# Patient Record
Sex: Female | Born: 1975
Health system: Southern US, Community
[De-identification: ages and names within clinical notes are randomized; demographics above are authoritative.]

## PROBLEM LIST (undated history)

## (undated) DIAGNOSIS — K649 Unspecified hemorrhoids: Secondary | ICD-10-CM

## (undated) DIAGNOSIS — R87629 Unspecified abnormal cytological findings in specimens from vagina: Secondary | ICD-10-CM

## (undated) DIAGNOSIS — R011 Cardiac murmur, unspecified: Secondary | ICD-10-CM

## (undated) DIAGNOSIS — B009 Herpesviral infection, unspecified: Secondary | ICD-10-CM

## (undated) HISTORY — PX: COLPOSCOPY: SHX161

## (undated) HISTORY — PX: OTHER SURGICAL HISTORY: SHX169

## (undated) HISTORY — DX: Unspecified hemorrhoids: K64.9

## (undated) HISTORY — DX: Herpesviral infection, unspecified: B00.9

## (undated) HISTORY — DX: Unspecified abnormal cytological findings in specimens from vagina: R87.629

## (undated) HISTORY — PX: BREAST BIOPSY: SHX20

## (undated) HISTORY — PX: TUBAL LIGATION: SHX77

## (undated) HISTORY — DX: Cardiac murmur, unspecified: R01.1

## (undated) HISTORY — PX: CARDIAC SURGERY: SHX584

---

## 2017-07-25 ENCOUNTER — Telehealth: Payer: Self-pay | Admitting: *Deleted

## 2017-07-25 ENCOUNTER — Encounter: Payer: Self-pay | Admitting: Obstetrics & Gynecology

## 2017-07-25 NOTE — Telephone Encounter (Signed)
Called pt this morning around 8:00am to make sure she had her medical records with her or if she had them sent and we didn't receive them. Left her a voicemail for a return call before her appt time of 9:15am. She was a NO SHOW for that appt.

## 2017-08-22 ENCOUNTER — Encounter: Payer: Self-pay | Admitting: Obstetrics and Gynecology

## 2017-08-22 ENCOUNTER — Ambulatory Visit: Payer: Self-pay | Admitting: Obstetrics and Gynecology

## 2017-08-22 NOTE — Progress Notes (Signed)
Erroneous encounter. Patient not seen.

## 2017-11-07 ENCOUNTER — Encounter: Payer: Self-pay | Admitting: Obstetrics & Gynecology

## 2017-11-07 DIAGNOSIS — Z01419 Encounter for gynecological examination (general) (routine) without abnormal findings: Secondary | ICD-10-CM

## 2017-12-04 ENCOUNTER — Ambulatory Visit (INDEPENDENT_AMBULATORY_CARE_PROVIDER_SITE_OTHER): Payer: BLUE CROSS/BLUE SHIELD | Admitting: Certified Nurse Midwife

## 2017-12-04 ENCOUNTER — Ambulatory Visit (INDEPENDENT_AMBULATORY_CARE_PROVIDER_SITE_OTHER): Payer: BLUE CROSS/BLUE SHIELD

## 2017-12-04 ENCOUNTER — Encounter: Payer: Self-pay | Admitting: Certified Nurse Midwife

## 2017-12-04 VITALS — BP 114/75 | HR 82 | Resp 16 | Ht 64.0 in | Wt 185.0 lb

## 2017-12-04 DIAGNOSIS — N852 Hypertrophy of uterus: Secondary | ICD-10-CM | POA: Diagnosis not present

## 2017-12-04 DIAGNOSIS — Z01419 Encounter for gynecological examination (general) (routine) without abnormal findings: Secondary | ICD-10-CM

## 2017-12-04 DIAGNOSIS — B9689 Other specified bacterial agents as the cause of diseases classified elsewhere: Secondary | ICD-10-CM

## 2017-12-04 DIAGNOSIS — Z124 Encounter for screening for malignant neoplasm of cervix: Secondary | ICD-10-CM

## 2017-12-04 DIAGNOSIS — Z113 Encounter for screening for infections with a predominantly sexual mode of transmission: Secondary | ICD-10-CM | POA: Diagnosis not present

## 2017-12-04 DIAGNOSIS — Z1151 Encounter for screening for human papillomavirus (HPV): Secondary | ICD-10-CM | POA: Diagnosis not present

## 2017-12-04 DIAGNOSIS — N760 Acute vaginitis: Secondary | ICD-10-CM | POA: Diagnosis not present

## 2017-12-04 DIAGNOSIS — N898 Other specified noninflammatory disorders of vagina: Secondary | ICD-10-CM

## 2017-12-04 DIAGNOSIS — K642 Third degree hemorrhoids: Secondary | ICD-10-CM

## 2017-12-04 NOTE — Patient Instructions (Signed)
Uterine Fibroids Uterine fibroids are tissue masses (tumors). They are also called leiomyomas. They can develop inside of a woman's womb (uterus). They can grow very large. Fibroids are not cancerous (benign). Most fibroids do not require medical treatment. Follow these instructions at home:  Keep all follow-up visits as told by your doctor. This is important.  Take medicines only as told by your doctor. ? If you were prescribed a hormone treatment, take the hormone medicines exactly as told. ? Do not take aspirin. It can cause bleeding.  Ask your doctor about taking iron pills and increasing the amount of dark green, leafy vegetables in your diet. These actions can help to boost your blood iron levels.  Pay close attention to your period. Tell your doctor about any changes, such as: ? Increased blood flow. This may require you to use more pads or tampons than usual per month. ? A change in the number of days that your period lasts per month. ? A change in symptoms that come with your period, such as back pain or cramping in your belly area (abdomen). Contact a doctor if:  You have pain in your back or the area between your hip bones (pelvic area) that is not controlled by medicines.  You have pain in your abdomen that is not controlled with medicines.  You have an increase in bleeding between and during periods.  You soak tampons or pads in a half hour or less.  You feel lightheaded.  You feel extra tired.  You feel weak. Get help right away if:  You pass out (faint).  You have a sudden increase in pelvic pain. This information is not intended to replace advice given to you by your health care provider. Make sure you discuss any questions you have with your health care provider. Document Released: 03/25/2010 Document Revised: 10/22/2015 Document Reviewed: 08/19/2013 Elsevier Interactive Patient Education  2018 Elsevier Inc.  

## 2017-12-05 NOTE — Progress Notes (Signed)
GYNECOLOGY ANNUAL PREVENTATIVE CARE ENCOUNTER NOTE  Subjective:   Patricia Griffith is a 42 y.o. [phone] female here for a new patient annual gynecologic exam.  Current complaints: hemorrhoids and vaginal irritation.   Denies abnormal vaginal bleeding, discharge, pelvic pain, or other gynecologic concerns. She reports no intercourse in the past 2 years.    Gynecologic History Patient's last menstrual period was 11/29/2017. Contraception: none Last Pap: patient does not recall date of last Pap but reports it being abnormal She reports never having a mammogram.   Obstetric History OB History  Gravida Para Term Preterm AB Living  5 3 3   2 3   SAB TAB Ectopic Multiple Live Births    2          # Outcome Date GA Lbr Len/2nd Weight Sex Delivery Anes PTL Lv  5 TAB           4 TAB           3 Term           2 Term           1 Term             Past Medical History:  Diagnosis Date  . Hemorrhoids   . HSV-2 (herpes simplex virus 2) infection   . Vaginal Pap smear, abnormal     Past Surgical History:  Procedure Laterality Date  . COLPOSCOPY    . TUBAL LIGATION      No current outpatient medications on file prior to visit.   No current facility-administered medications on file prior to visit.     Allergies  Allergen Reactions  . Sulfa Antibiotics Hives  . Tucks Swelling and Rash    Social History:  reports that she has been smoking cigarettes. She has a 18.00 pack-year smoking history. She has never used smokeless tobacco. She reports that she drinks alcohol. She reports that she does not use drugs.  Family History  Problem Relation Age of Onset  . Heart failure Mother   . Diabetes Mother   . Congestive Heart Failure Father   . Breast cancer Maternal Grandmother   . Diabetes Maternal Grandmother   . Liver cancer Paternal Grandmother   . Prostate cancer Paternal Grandfather     The following portions of the patient's history were reviewed and updated as  appropriate: allergies, current medications, past family history, past medical history, past social history, past surgical history and problem list.  Review of Systems Pertinent items noted in HPI and remainder of comprehensive ROS otherwise negative.   Objective:  BP 114/75   Pulse 82   Resp 16   Ht 5\' 4"  (1.626 m)   Wt 185 lb (83.9 kg)   LMP 11/29/2017   BMI 31.76 kg/m  CONSTITUTIONAL: Well-developed, well-nourished female in no acute distress.  HENT:  Normocephalic, atraumatic, External right and left ear normal. Oropharynx is clear and moist EYES: Conjunctivae and EOM are normal. Pupils are equal, round, and reactive to light. NECK: Normal range of motion, supple, no masses.  Normal thyroid.  SKIN: Skin is warm and dry. No rash noted. Not diaphoretic. No erythema. No pallor. MUSCULOSKELETAL: Normal range of motion. No tenderness.  No cyanosis, clubbing, or edema.  2+ distal pulses. NEUROLOGIC: Alert and oriented to person, place, and time. Normal reflexes, muscle tone coordination.  PSYCHIATRIC: Normal mood and affect. Normal behavior. Normal judgment and thought content. CARDIOVASCULAR: Normal heart rate noted, regular rhythm RESPIRATORY: Clear to auscultation bilaterally.  Effort and breath sounds normal, no problems with respiration noted. BREASTS: Symmetric in size. No masses, skin changes, nipple drainage, or lymphadenopathy. ABDOMEN: Soft, normal bowel sounds, no distention noted.  No tenderness, rebound or guarding.  PELVIC: Normal appearing external genitalia; normal appearing vaginal mucosa and cervix.  No abnormal discharge noted.  Pap smear obtained.  Enlarged uterus, no other palpable masses, no uterine or adnexal tenderness.   Assessment and Plan:  1. Encounter for annual routine gynecological examination - Well woman examination complete, normal except for enlarged uterus, possible fibroid  - Patient denies history of fibroids or family hx  - US Transvaginal Non-OB;  Future - Cytology - PAP  2. Enlarged uterus - US Transvaginal Non-OB completed directly after appointment  - [place] reports reviewed, no abnormalities and normal sized uterus   3. Well woman exam - Educated on the importance of starting mammogram screenings this year due to age, patient verbalizes understanding  - MS Digital Screening; Future  4. Grade III hemorrhoids - External hemorrhoids, not thrombosed and easily reduced  - Patient reports high diet in carbs and no fiber, educated and discussed importance of increasing amount of fiber in diet to reduce amount of strain. Use of hemorrhoid cream external instead of internal suppository and warm sitz baths.    Will follow up results of pap smear and manage accordingly. Mammogram scheduled Routine preventative health maintenance measures emphasized. Please refer to After Visit Summary for other counseling recommendations.    Lajean Manes, [place] for [employer], [place]

## 2017-12-06 LAB — CYTOLOGY - PAP
Adequacy: ABSENT
Bacterial vaginitis: POSITIVE — AB
Candida vaginitis: NEGATIVE
Chlamydia: NEGATIVE
Diagnosis: NEGATIVE
HPV: NOT DETECTED
Neisseria Gonorrhea: NEGATIVE
Trichomonas: NEGATIVE

## 2017-12-06 MED ORDER — TINIDAZOLE 500 MG PO TABS: 2.0000 g | ORAL_TABLET | Freq: Every day | ORAL | 0 refills | Status: DC

## 2017-12-06 NOTE — Addendum Note (Signed)
Addended by: Lajean Manes on: 12/06/2017 09:04 AM   Modules accepted: Orders

## 2017-12-14 ENCOUNTER — Ambulatory Visit: Payer: BLUE CROSS/BLUE SHIELD

## 2017-12-20 ENCOUNTER — Ambulatory Visit (INDEPENDENT_AMBULATORY_CARE_PROVIDER_SITE_OTHER): Payer: BLUE CROSS/BLUE SHIELD

## 2017-12-20 ENCOUNTER — Ambulatory Visit: Payer: BLUE CROSS/BLUE SHIELD | Admitting: Osteopathic Medicine

## 2017-12-20 ENCOUNTER — Telehealth: Payer: Self-pay | Admitting: Osteopathic Medicine

## 2017-12-20 DIAGNOSIS — Z1231 Encounter for screening mammogram for malignant neoplasm of breast: Secondary | ICD-10-CM | POA: Diagnosis not present

## 2017-12-20 DIAGNOSIS — Z01419 Encounter for gynecological examination (general) (routine) without abnormal findings: Secondary | ICD-10-CM

## 2017-12-20 DIAGNOSIS — R928 Other abnormal and inconclusive findings on diagnostic imaging of breast: Secondary | ICD-10-CM | POA: Diagnosis not present

## 2017-12-20 NOTE — Telephone Encounter (Signed)
Looks like Donn Pierini has already rescheduled with this patient in Epic

## 2017-12-20 NOTE — Telephone Encounter (Signed)
No-show to establish care with Dr. Sheppard Coil, 12/20/17. If late or no-show again, will not accept patient to this clinic. Please call her to reschedule visit and inform them of this policy.

## 2017-12-20 NOTE — Telephone Encounter (Signed)
Routing to scheduler

## 2017-12-21 ENCOUNTER — Other Ambulatory Visit: Payer: Self-pay | Admitting: Certified Nurse Midwife

## 2017-12-21 DIAGNOSIS — R928 Other abnormal and inconclusive findings on diagnostic imaging of breast: Secondary | ICD-10-CM

## 2018-01-03 ENCOUNTER — Other Ambulatory Visit: Payer: Self-pay | Admitting: Certified Nurse Midwife

## 2018-01-03 ENCOUNTER — Ambulatory Visit
Admission: RE | Admit: 2018-01-03 | Discharge: 2018-01-03 | Disposition: A | Discharge status: Home / Self Care | Payer: BLUE CROSS/BLUE SHIELD | Source: Ambulatory Visit | Attending: Certified Nurse Midwife | Admitting: Certified Nurse Midwife

## 2018-01-03 DIAGNOSIS — R599 Enlarged lymph nodes, unspecified: Secondary | ICD-10-CM

## 2018-01-03 DIAGNOSIS — R928 Other abnormal and inconclusive findings on diagnostic imaging of breast: Secondary | ICD-10-CM | POA: Diagnosis not present

## 2018-01-08 ENCOUNTER — Inpatient Hospital Stay: Admission: RE | Admit: 2018-01-08 | Payer: BLUE CROSS/BLUE SHIELD | Source: Ambulatory Visit

## 2018-01-15 ENCOUNTER — Ambulatory Visit
Admission: RE | Admit: 2018-01-15 | Discharge: 2018-01-15 | Disposition: A | Discharge status: Home / Self Care | Payer: BLUE CROSS/BLUE SHIELD | Source: Ambulatory Visit | Attending: Certified Nurse Midwife | Admitting: Certified Nurse Midwife

## 2018-01-15 ENCOUNTER — Other Ambulatory Visit (HOSPITAL_COMMUNITY)
Admission: RE | Admit: 2018-01-15 | Payer: BLUE CROSS/BLUE SHIELD | Source: Ambulatory Visit | Admitting: Diagnostic Radiology

## 2018-01-15 ENCOUNTER — Other Ambulatory Visit: Payer: Self-pay | Admitting: Certified Nurse Midwife

## 2018-01-15 DIAGNOSIS — I898 Other specified noninfective disorders of lymphatic vessels and lymph nodes: Secondary | ICD-10-CM | POA: Diagnosis not present

## 2018-01-15 DIAGNOSIS — R599 Enlarged lymph nodes, unspecified: Secondary | ICD-10-CM

## 2018-01-15 DIAGNOSIS — R590 Localized enlarged lymph nodes: Secondary | ICD-10-CM | POA: Diagnosis not present

## 2018-01-16 ENCOUNTER — Ambulatory Visit (INDEPENDENT_AMBULATORY_CARE_PROVIDER_SITE_OTHER): Payer: BLUE CROSS/BLUE SHIELD | Admitting: Osteopathic Medicine

## 2018-01-16 ENCOUNTER — Encounter: Payer: Self-pay | Admitting: Osteopathic Medicine

## 2018-01-16 VITALS — BP 118/61 | HR 74 | Temp 98.4°F | Ht 63.0 in | Wt 194.8 lb

## 2018-01-16 DIAGNOSIS — Z13220 Encounter for screening for lipoid disorders: Secondary | ICD-10-CM | POA: Diagnosis not present

## 2018-01-16 DIAGNOSIS — Z131 Encounter for screening for diabetes mellitus: Secondary | ICD-10-CM

## 2018-01-16 DIAGNOSIS — Z0000 Encounter for general adult medical examination without abnormal findings: Secondary | ICD-10-CM | POA: Diagnosis not present

## 2018-01-16 DIAGNOSIS — R011 Cardiac murmur, unspecified: Secondary | ICD-10-CM | POA: Diagnosis not present

## 2018-01-16 MED ORDER — NAPROXEN 500 MG PO TABS: 500.0000 mg | ORAL_TABLET | Freq: Two times a day (BID) | ORAL | 3 refills | Status: AC

## 2018-01-16 NOTE — Progress Notes (Signed)
HPI: Patricia Griffith is a 42 y.o. female who  has a past medical history of Hemorrhoids, HSV-2 (herpes simplex virus 2) infection, and Vaginal Pap smear, abnormal.  she presents to [facility] today, 01/16/18,  for chief complaint of: New to establish care  History of low back pain/sciatica  Pleasant new patient here to establish care.  Moved to the area from [place].  No significant medical problems other than intermittent low back pain/sciatica, typically well controlled by naproxen.  She previously had a prescription for tramadol to use as needed.  Non-smoker, half a pack a day for about 19 years.  No drug or alcohol use.  Declined flu shot, reports tetanus shot about 2 years ago.  G5 O8-7-8-6, no complications with previous pregnancy.  Not currently sexually active, declines STD testing.  Cis Female, heterosexual.  Along with OB/GYN for mammograms and Pap smears.  Faint systolic heart murmur auscultated on exam, patient states that she had surgery on 1 of her valves as a child, she is really not sure what the procedure was.   Past medical, surgical, social and family history reviewed:  There are no active problems to display for this patient.   Past Surgical History:  Procedure Laterality Date  . CESAREAN SECTION     x1  . COLPOSCOPY    . OTHER SURGICAL HISTORY     unknown cardiac procedure as a child  . TUBAL LIGATION      Social History   Tobacco Use  . Smoking status: Current Every Day Smoker    Packs/day: 0.50    Years: 18.00    Pack years: 9.00    Types: Cigarettes  . Smokeless tobacco: Never Used  Substance Use Topics  . Alcohol use: Yes    Comment: socially    Family History  Problem Relation Age of Onset  . Heart failure Mother   . Diabetes Mother   . Congestive Heart Failure Father   . Breast cancer Maternal Grandmother   . Diabetes Maternal Grandmother   . Liver cancer Paternal Grandmother   . Prostate cancer  Paternal Grandfather      Current medication list and allergy/intolerance information reviewed:    Current Outpatient Medications  Medication Sig Dispense Refill  . tinidazole (TINDAMAX) 500 MG tablet Take 4 tablets (2,000 mg total) by mouth daily with breakfast. For two days 8 tablet 0   No current facility-administered medications for this visit.     Allergies  Allergen Reactions  . Sulfa Antibiotics Hives  . Tucks Swelling and Rash      Review of Systems:  Constitutional:  No  fever, no chills, No recent illness, No unintentional weight changes. No significant fatigue.   HEENT: No  headache, no vision change, no hearing change, No sore throat, No  sinus pressure  Cardiac: No  chest pain, No  pressure, No palpitations, No  Orthopnea  Respiratory:  No  shortness of breath. No  Cough  Gastrointestinal: No  abdominal pain, No  nausea, No  vomiting,  No  blood in stool, No  diarrhea, No  constipation   Musculoskeletal: No new myalgia/arthralgia  Skin: No  Rash, No other wounds/concerning lesions  Genitourinary: No  incontinence, No  abnormal genital bleeding, No abnormal genital discharge  Hem/Onc: No  easy bruising/bleeding, No  abnormal lymph node  Endocrine: No cold intolerance,  No heat intolerance. No polyuria/polydipsia/polyphagia   Neurologic: No  weakness, No  dizziness, No  slurred speech/focal weakness/facial droop  Psychiatric: No  concerns with depression, No  concerns with anxiety, No sleep problems, No mood problems  Exam:  BP 118/61 (BP Location: Left Arm, Patient Position: Sitting, Cuff Size: Large)   Pulse 74   Temp 98.4 F (36.9 C) (Oral)   Ht _0  (1.6 m)   Wt 194 lb 12.8 oz (88.4 kg)   LMP 01/05/2018   BMI 34.51 kg/m   Constitutional: VS see above. General Appearance: alert, well-developed, well-nourished, NAD  Eyes: Normal lids and conjunctive, non-icteric sclera  Ears, Nose, Mouth, Throat: MMM, Normal external inspection  ears/nares/mouth/lips/gums. TM normal bilaterally. Pharynx/tonsils no erythema, no exudate. Nasal mucosa normal.   Neck: No masses, trachea midline. No thyroid enlargement. No tenderness/mass appreciated. No lymphadenopathy  Respiratory: Normal respiratory effort. no wheeze, no rhonchi, no rales  Cardiovascular: S1/S2 normal, +murmur, no rub/gallop auscultated. RRR. No lower extremity edema. Pedal pulse II/IV bilaterally DP and PT.   Gastrointestinal: Nontender, no masses. No hepatomegaly, no splenomegaly. No hernia appreciated. Bowel sounds normal. Rectal exam deferred.   Musculoskeletal: Gait normal. No clubbing/cyanosis of digits.   Neurological: Normal balance/coordination. No tremor. No cranial nerve deficit on limited exam. Motor and sensation intact and symmetric. Cerebellar reflexes intact.   Skin: warm, dry, intact. No rash/ulcer  Psychiatric: Normal judgment/insight. Normal mood and affect. Oriented x3.    No results found for this or any previous visit (from the past 72 hour(s)).  [place] Axillary Node Core Biopsy Right  Result Date: 01/15/2018 CLINICAL DATA:  Indeterminate bilateral cortically thickened axillary lymph nodes. For ultrasound-guided core needle biopsy of a thickened right axillary lymph node. EXAM: [place] AXILLARY NODE CORE BIOPSY RIGHT COMPARISON:  Previous exam(s). FINDINGS: I met with the patient and we discussed the procedure of ultrasound-guided biopsy, including benefits and alternatives. We discussed the high likelihood of a successful procedure. We discussed the risks of the procedure, including infection, bleeding, tissue injury, clip migration, and inadequate sampling. Informed written consent was given. The usual time-out protocol was performed immediately prior to the procedure. Using sterile technique and 1% Lidocaine as local anesthetic, under direct ultrasound visualization, a 14 gauge spring-loaded device was used to perform biopsy of cortically thickened  right axillary lymph node using a lateral approach. At the conclusion of the procedure a HydroMARK tissue marker clip was deployed into the biopsy cavity. Follow up 2 view mammogram was performed and dictated separately. IMPRESSION: Ultrasound guided biopsy of right axillary lymph node. No apparent complications. Electronically Signed   By: Lovey Newcomer M.D.   On: 01/15/2018 16:28   Mm Clip Placement Right  Result Date: 01/15/2018 CLINICAL DATA:  Patient status post ultrasound-guided core needle biopsy cortically thickened right axillary lymph node. EXAM: DIAGNOSTIC RIGHT MAMMOGRAM POST ULTRASOUND BIOPSY COMPARISON:  Previous exam(s). FINDINGS: Mammographic images were obtained following ultrasound guided biopsy of cortically thickened right axillary lymph node. HydroMARK clip in appropriate position. IMPRESSION: Appropriate position HydroMARK clip status post ultrasound-guided biopsy cortically thickened right axillary lymph node. Final Assessment: Post Procedure Mammograms for Marker Placement Electronically Signed   By: Lovey Newcomer M.D.   On: 01/15/2018 16:27     ASSESSMENT/PLAN:   Annual physical exam - labs ordered for future visit  - Plan: CBC, COMPLETE METABOLIC PANEL WITH GFR, Lipid panel, TSH  Heart murmur - Patient would like to hold off on echocardiogram at this point, as long as labs are normal. - Plan: CBC, COMPLETE METABOLIC PANEL WITH GFR, Lipid panel, TSH  Lipid screening - Plan: CBC, COMPLETE METABOLIC PANEL WITH  GFR, Lipid panel, TSH  Diabetes mellitus screening - Plan: CBC, COMPLETE METABOLIC PANEL WITH GFR, Lipid panel, TSH     General Preventive Care  Most recent routine screening lipids/other labs ordered  Everyone should have blood pressure checked once per year.   Tobacco: don't! Alcohol: responsible moderation is ok for most adults - if you have concerns about your alcohol intake, please talk to me! Recreational/Illicit Drugs: don't!  Exercise: as tolerated to  reduce risk of cardiovascular disease and diabetes. Strength training will also prevent osteoporosis.   Mental health: if need for mental health care (medicines, counseling, other), or concerns about moods, please let me know!   Sexual health: if need for STD testing, or if concerns with libido/pain problems, please let me know! If you need to discuss your birth control options, please let me know!  Vaccines  Flu vaccine: recommended  Shingles vaccine: Shingrix recommended after age 66   Pneumonia vaccines: Prevnar and Pneumovax recommended after age 62  Tetanus booster: Tdap recommended every 10 years Cancer screenings   Colon cancer screening: recommended for everyone at age 69, but some folks need a colonoscopy sooner if risk factors - no FH  Breast cancer screening: mammogram recommended at age 56 every other year at least, and annually after age 51.   Cervical cancer screening: every 1 to 5 years depending on age and other risk factors. Can stop at age 32 or w/ hysterectomy as long as previous testing was normal.   Lung cancer screening: CT chest every year for those sge 6 to 40 years old with  ?30 pack year smoking history, who either currently smoke or have quit within the past 15 years Infection screenings . HIV: recommended screening at least once age 63-65, more often if risk factors  . Gonorrhea/Chlamydia: screening as needed . Hepatitis C: recommended for anyone born [birth year]-1965 . TB: certain at-risk populations, or depending on work requirements and/or travel history Other . Bone Density Test: recommended for women at age 23, men at age 91, sooner depending on risk factors . Advanced Directive: Living Will and/or Healthcare Power of Attorney recommended for all adults, regardless of age or health!       Visit summary with medication list and pertinent instructions was printed for patient to review. All questions at time of visit were answered - patient instructed to  contact office with any additional concerns. ER/RTC precautions were reviewed with the patient.   Follow-up plan: Return in about 1 year (around 01/17/2019), or sooner if needed , for annual check up .    Please note: voice recognition software was used to produce this document, and typos may escape review. Please contact Dr. Sheppard Coil for any needed clarifications.

## 2018-01-18 ENCOUNTER — Encounter: Payer: Self-pay | Admitting: Osteopathic Medicine

## 2018-01-18 DIAGNOSIS — R011 Cardiac murmur, unspecified: Secondary | ICD-10-CM | POA: Insufficient documentation

## 2018-03-29 ENCOUNTER — Encounter: Payer: Self-pay | Admitting: Osteopathic Medicine

## 2018-03-29 ENCOUNTER — Ambulatory Visit (INDEPENDENT_AMBULATORY_CARE_PROVIDER_SITE_OTHER): Payer: BLUE CROSS/BLUE SHIELD | Admitting: Osteopathic Medicine

## 2018-03-29 VITALS — BP 121/60 | HR 76 | Temp 99.2°F | Wt 190.2 lb

## 2018-03-29 DIAGNOSIS — K649 Unspecified hemorrhoids: Secondary | ICD-10-CM

## 2018-03-29 DIAGNOSIS — N898 Other specified noninflammatory disorders of vagina: Secondary | ICD-10-CM | POA: Diagnosis not present

## 2018-03-29 LAB — POCT URINALYSIS DIPSTICK
Bilirubin, UA: NEGATIVE
Glucose, UA: NEGATIVE
KETONES UA: NEGATIVE
NITRITE UA: NEGATIVE
PH UA: 5.5 (ref 5.0–8.0)
PROTEIN UA: NEGATIVE
RBC UA: NEGATIVE
Spec Grav, UA: 1.025 (ref 1.010–1.025)
UROBILINOGEN UA: 0.2 U/dL

## 2018-03-29 MED ORDER — BORIC ACID CRYS: CRYSTALS | 1 refills | Status: DC

## 2018-03-29 MED ORDER — FLUCONAZOLE 150 MG PO TABS: 150.0000 mg | ORAL_TABLET | Freq: Once | ORAL | 1 refills | Status: AC

## 2018-03-29 MED ORDER — METRONIDAZOLE 500 MG PO TABS: 500.0000 mg | ORAL_TABLET | Freq: Two times a day (BID) | ORAL | 0 refills | Status: AC

## 2018-03-29 NOTE — Progress Notes (Signed)
HPI: Patricia Griffith is a 43 y.o. female who  has a past medical history of Hemorrhoids, HSV-2 (herpes simplex virus 2) infection, and Vaginal Pap smear, abnormal.  she presents to [facility] today, 03/29/18,  for chief complaint of:  Vaginal discharge  Genital discharge ongoing on and off for about 2 months.  White, thick, reports foul odor.  Patient states she went to the OB/GYN office in this building about a month ago, I cannot see any record of this visit or applicable labs/cultures, will check for possible duplicate chart.  She states she was treated for a "bacterial infection" and other testing was negative.  She has not been sexually active.  Does NOT use douching products.      At today's visit 03/29/18 ... PMH, PSH, FH reviewed and updated as needed.  Current medication list and allergy/intolerance hx reviewed and updated as needed. (See remainder of HPI, ROS, Phys Exam below)    Results for orders placed or performed in visit on 03/29/18 (from the past 24 hour(s))  POCT Urinalysis Dipstick     Status: Abnormal   Collection Time: 03/29/18 10:32 AM  Result Value Ref Range   Color, UA YELLOW    Clarity, UA TURBID    Glucose, UA Negative Negative   Bilirubin, UA NEGATIVE    Ketones, UA NEGATIVE    Spec Grav, UA 1.025 1.010 - 1.025   Blood, UA NEGATIVE    pH, UA 5.5 5.0 - 8.0   Protein, UA Negative Negative   Urobilinogen, UA 0.2 0.2 or 1.0 E.U./dL   Nitrite, UA NEGATIVE    Leukocytes, UA Trace (A) Negative   Appearance     Odor             ASSESSMENT/PLAN: The primary encounter diagnosis was Vaginal discharge. A diagnosis of Hemorrhoids, unspecified hemorrhoid type was also pertinent to this visit.    Orders Placed This Encounter  Procedures  . Urine Culture  . C. trachomatis/N. gonorrhoeae RNA  . Urinalysis, microscopic only  . Ambulatory referral to Gastroenterology  . POCT Urinalysis Dipstick     Meds ordered  this encounter  Medications  . metroNIDAZOLE (FLAGYL) 500 MG tablet    Sig: Take 1 tablet (500 mg total) by mouth 2 (two) times daily for 7 days.    Dispense:  14 tablet    Refill:  0  . Boric Acid CRYS    Sig: 600 mg vaginally qhs x 14-21 days. Dips: qs 21 days  [facility] [id number], [place], [place] [address] Phone: [phone]    Dispense:  12000 g    Refill:  1  . fluconazole (DIFLUCAN) 150 MG tablet    Sig: Take 1 tablet (150 mg total) by mouth once for 1 dose. Repeat dose 72 hours if yeast infection persists    Dispense:  2 tablet    Refill:  1    Patient Instructions  Will confirm negative gonorrhea/chlamydia as this can be present in the vagina for some time before it causes a problem, we should rule it out.  We will treat for bacterial vaginosis/yeast infection.  Diflucan 2 pills, 48-72 hours apart, and metronidazole 1 pill twice daily for a week. These are waiting for you at the pharmacy  If this treatment does not seem to be working, please see the printed prescription for the boric acid and take this to [facility] in [facility] - see Rx for address.  Follow-up plan: Return if symptoms worsen or fail to improve.                                                 ################################################# ################################################# ################################################# #################################################    Current Meds  Medication Sig  . naproxen (NAPROSYN) 500 MG tablet Take 1 tablet (500 mg total) by mouth 2 (two) times daily with a meal.    Allergies  Allergen Reactions  . Sulfa Antibiotics Hives and Other (See Comments)    Blindness  . Tucks Swelling and Rash       Review of Systems:  Constitutional: No recent illness  HEENT: No  headache, no vision change  Cardiac: No  chest pain, No  pressure, No  palpitations  Respiratory:  No  shortness of breath. No  Cough  Gastrointestinal: No  abdominal pain, no change on bowel habits, +hemorrhoids  Musculoskeletal: No new myalgia/arthralgia  Skin: No  Rash  GU: As per HPI  Neurologic: No  weakness, No  Dizziness   Exam:  BP 121/60 (BP Location: Left Arm, Patient Position: Sitting, Cuff Size: Normal)   Pulse 76   Temp 99.2 F (37.3 C) (Oral)   Wt 190 lb 3.2 oz (86.3 kg)   BMI 33.69 kg/m   Constitutional: VS see above. General Appearance: alert, well-developed, well-nourished, NAD  Eyes: Normal lids and conjunctive, non-icteric sclera  Ears, Nose, Mouth, Throat: MMM, Normal external inspection ears/nares/mouth/lips/gums.  Neck: No masses, trachea midline.   Respiratory: Normal respiratory effort.   Musculoskeletal: Gait normal. Symmetric and independent movement of all extremities  Neurological: Normal balance/coordination. No tremor.  Skin: warm, dry, intact.   Psychiatric: Normal judgment/insight. Normal mood and affect. Oriented x3.  GYN: No lesions/ulcers to external genitalia, normal urethra, thick white and yellow vaginal discharge, normal mucosa, cervix normal without lesions, uterus not enlarged or tender, adnexa no masses and nontender       Visit summary with medication list and pertinent instructions was printed for patient to review, patient was advised to alert [place] if any updates are needed. All questions at time of visit were answered - patient instructed to contact office with any additional concerns. ER/RTC precautions were reviewed with the patient and understanding verbalized.     Please note: voice recognition software was used to produce this document, and typos may escape review. Please contact Dr. Sheppard Coil for any needed clarifications.    Follow up plan: Return if symptoms worsen or fail to improve.

## 2018-03-29 NOTE — Patient Instructions (Signed)
Will confirm negative gonorrhea/chlamydia as this can be present in the vagina for some time before it causes a problem, we should rule it out.  We will treat for bacterial vaginosis/yeast infection.  Diflucan 2 pills, 48-72 hours apart, and metronidazole 1 pill twice daily for a week. These are waiting for you at the pharmacy  If this treatment does not seem to be working, please see the printed prescription for the boric acid and take this to [facility] in [facility] - see Rx for address.

## 2018-03-30 LAB — C. TRACHOMATIS/N. GONORRHOEAE RNA
C. trachomatis RNA, TMA: NOT DETECTED
N. GONORRHOEAE RNA, TMA: NOT DETECTED

## 2018-03-31 LAB — URINALYSIS, MICROSCOPIC ONLY

## 2018-03-31 LAB — URINE CULTURE
MICRO NUMBER:: 101782
RESULT: NO GROWTH
SPECIMEN QUALITY: ADEQUATE

## 2018-05-30 ENCOUNTER — Encounter: Payer: Self-pay | Admitting: Osteopathic Medicine

## 2019-01-17 ENCOUNTER — Encounter: Payer: Self-pay | Admitting: Gastroenterology

## 2019-02-21 ENCOUNTER — Ambulatory Visit: Payer: BC Managed Care – PPO | Admitting: Gastroenterology

## 2019-03-20 ENCOUNTER — Other Ambulatory Visit: Payer: Self-pay

## 2019-03-20 ENCOUNTER — Inpatient Hospital Stay: Admission: RE | Admit: 2019-03-20 | Payer: BC Managed Care – PPO | Source: Ambulatory Visit

## 2019-03-24 ENCOUNTER — Ambulatory Visit (INDEPENDENT_AMBULATORY_CARE_PROVIDER_SITE_OTHER): Payer: BC Managed Care – PPO | Admitting: Nurse Practitioner

## 2019-03-24 DIAGNOSIS — Z5329 Procedure and treatment not carried out because of patient's decision for other reasons: Secondary | ICD-10-CM

## 2019-04-01 ENCOUNTER — Encounter: Payer: Self-pay | Admitting: Medical-Surgical

## 2019-04-01 ENCOUNTER — Other Ambulatory Visit: Payer: Self-pay

## 2019-04-01 ENCOUNTER — Ambulatory Visit (INDEPENDENT_AMBULATORY_CARE_PROVIDER_SITE_OTHER): Payer: BC Managed Care – PPO | Admitting: Medical-Surgical

## 2019-04-01 VITALS — BP 131/81 | HR 71 | Temp 98.1°F | Ht 63.0 in | Wt 192.7 lb

## 2019-04-01 DIAGNOSIS — K649 Unspecified hemorrhoids: Secondary | ICD-10-CM

## 2019-04-01 DIAGNOSIS — R140 Abdominal distension (gaseous): Secondary | ICD-10-CM

## 2019-04-01 DIAGNOSIS — K5900 Constipation, unspecified: Secondary | ICD-10-CM | POA: Diagnosis not present

## 2019-04-01 DIAGNOSIS — Z0000 Encounter for general adult medical examination without abnormal findings: Secondary | ICD-10-CM

## 2019-04-01 MED ORDER — HYDROCORTISONE ACETATE 25 MG RE SUPP: 25.0000 mg | Freq: Two times a day (BID) | RECTAL | 2 refills | Status: DC | PRN

## 2019-04-01 NOTE — Progress Notes (Signed)
HPI: Patricia Griffith is a 44 y.o. female who  has a past medical history of Hemorrhoids, HSV-2 (herpes simplex virus 2) infection, and Vaginal Pap smear, abnormal.  she presents to [facility] today, 04/01/19,  for chief complaint of:  Annual physical exam  Hemorrhoids: had them for years but causing significant discomfort. Blood in stools more often than not. Passed clots with BM this morning. Feeling of constipation from hemorrhoid swelling.  Has tried OTC Preparation H suppositories, minimal relief.  Nausea/constipation/Gas pain: having increased flatulence and gas pains in the epigastric region and chest, relieved by belching. Nausea intermittent, worse with eating. BMs every 3 to 4 days with eating flax seed, raisins, drinking prune juice.   Past medical, surgical, social and family history reviewed:  Patient Active Problem List   Diagnosis Date Noted  . Heart murmur 01/18/2018    Past Surgical History:  Procedure Laterality Date  . CESAREAN SECTION     x1  . COLPOSCOPY    . OTHER SURGICAL HISTORY     unknown cardiac procedure as a child  . TUBAL LIGATION      Social History   Tobacco Use  . Smoking status: Current Every Day Smoker    Packs/day: 0.50    Years: 18.00    Pack years: 9.00    Types: Cigarettes  . Smokeless tobacco: Never Used  Substance Use Topics  . Alcohol use: Yes    Comment: socially    Family History  Problem Relation Age of Onset  . Heart failure Mother   . Diabetes Mother   . Congestive Heart Failure Father   . Breast cancer Maternal Grandmother   . Diabetes Maternal Grandmother   . Liver cancer Paternal Grandmother   . Prostate cancer Paternal Grandfather   . Colon cancer Maternal Grandfather      Current medication list and allergy/intolerance information reviewed:    Current Outpatient Medications  Medication Sig Dispense Refill  . hydrocortisone (ANUSOL-HC) 25 MG suppository Place 1 suppository  (25 mg total) rectally 2 (two) times daily as needed for hemorrhoids. 24 suppository 2   No current facility-administered medications for this visit.    Allergies  Allergen Reactions  . Sulfa Antibiotics Hives and Other (See Comments)    Blindness  . Witch Hazel Swelling and Rash      Review of Systems:  Constitutional:  No  fever, no chills, No recent illness, No unintentional weight changes. No significant fatigue.   HEENT: No  headache, no vision change, no hearing change, No sore throat, No  sinus pressure  Cardiac: No  chest pain, No  pressure, No palpitations, No  Orthopnea  Respiratory:  No  shortness of breath. No  Cough  Gastrointestinal: No  vomiting,  No  blood in stool, No  constipation   Musculoskeletal: No new myalgia/arthralgia  Skin: No  Rash, No other wounds/concerning lesions  Genitourinary: No  incontinence, No  abnormal genital bleeding, No abnormal genital discharge  Hem/Onc: No  easy bruising/bleeding, No  abnormal lymph node  Endocrine: No cold intolerance,  No heat intolerance. No polyuria/polydipsia/polyphagia   Neurologic: No  weakness, No  dizziness, No  slurred speech/focal weakness/facial droop  Psychiatric: No  concerns with depression, No  concerns with anxiety, No sleep problems, No mood problems  Exam:  BP 131/81   Pulse 71   Temp 98.1 F (36.7 C) (Oral)   Ht 5' 3"  (1.6 m)   Wt 192 lb 11.2 oz (87.4 kg)  SpO2 100%   BMI 34.14 kg/m   Constitutional: VS see above. General Appearance: alert, well-developed, well-nourished, NAD  Eyes: Normal lids and conjunctive, non-icteric sclera  Ears, Nose, Mouth, Throat: MMM, Normal external inspection ears/nares/mouth/lips/gums. TM normal bilaterally. Pharynx/tonsils no erythema, no exudate. Nasal mucosa normal.   Neck: No masses, trachea midline. No thyroid enlargement. No tenderness/mass appreciated. No lymphadenopathy  Respiratory: Normal respiratory effort. no wheeze, no rhonchi, no  rales  Cardiovascular: B5/Z0 normal, systolic mumur grade 2/6, no rub/gallop auscultated. RRR. No lower extremity edema. Pedal pulse II/IV bilaterally DP and PT. No carotid bruit or JVD. No abdominal aortic bruit.  Gastrointestinal: Nontender, no masses. No hepatomegaly, no splenomegaly. No hernia appreciated. Bowel sounds normal. Rectal exam deferred.   Musculoskeletal: Gait normal. No clubbing/cyanosis of digits.   Neurological: Normal balance/coordination. No tremor. No cranial nerve deficit on limited exam. Motor and sensation intact and symmetric. Cerebellar reflexes intact.   Skin: warm, dry, intact. No rash/ulcer. No concerning nevi or subq nodules on limited exam.    Psychiatric: Normal judgment/insight. Normal mood and affect. Oriented x3.    No results found for this or any previous visit (from the past 72 hour(s)).  No results found.   ASSESSMENT/PLAN: The primary encounter diagnosis was Annual physical exam. Diagnoses of Hemorrhoids, unspecified hemorrhoid type, Constipation, unspecified constipation type, and Flatulence/gas pain/belching were also pertinent to this visit.  Annual physical exam  Checking CBC, CMP, TSH, and lipids  Screening mammogram ordered. Hemorrhoids  Anusol suppositories twice daily as needed Constipation/flatulence/gas pain/belching  Refer to GI for evaluation  Orders Placed This Encounter  Procedures  . MM Digital Screening  . CBC  . COMPLETE METABOLIC PANEL WITH GFR  . TSH  . Lipid panel  . Ambulatory referral to Gastroenterology    Meds ordered this encounter  Medications  . hydrocortisone (ANUSOL-HC) 25 MG suppository    Sig: Place 1 suppository (25 mg total) rectally 2 (two) times daily as needed for hemorrhoids.    Dispense:  24 suppository    Refill:  2    Order Specific Question:   Supervising Provider    Answer:   Emeterio Reeve [[id number]]    Patient Instructions  Preventive Care 19-56 Years Old, Female Preventive  care refers to visits with your health care provider and lifestyle choices that can promote health and wellness. This includes:  A yearly physical exam. This may also be called an annual well check.  Regular dental visits and eye exams.  Immunizations.  Screening for certain conditions.  Healthy lifestyle choices, such as eating a healthy diet, getting regular exercise, not using drugs or products that contain nicotine and tobacco, and limiting alcohol use. What can I expect for my preventive care visit? Physical exam Your health care provider will check your:  Height and weight. This may be used to calculate body mass index (BMI), which tells if you are at a healthy weight.  Heart rate and blood pressure.  Skin for abnormal spots. Counseling Your health care provider may ask you questions about your:  Alcohol, tobacco, and drug use.  Emotional well-being.  Home and relationship well-being.  Sexual activity.  Eating habits.  Work and work Statistician.  Method of birth control.  Menstrual cycle.  Pregnancy history. What immunizations do I need?  Influenza (flu) vaccine  This is recommended every year. Tetanus, diphtheria, and pertussis (Tdap) vaccine  You may need a Td booster every 10 years. Varicella (chickenpox) vaccine  You may need this if you have  not been vaccinated. Zoster (shingles) vaccine  You may need this after age 47. Measles, mumps, and rubella (MMR) vaccine  You may need at least one dose of MMR if you were born in 1957 or later. You may also need a second dose. Pneumococcal conjugate (PCV13) vaccine  You may need this if you have certain conditions and were not previously vaccinated. Pneumococcal polysaccharide (PPSV23) vaccine  You may need one or two doses if you smoke cigarettes or if you have certain conditions. Meningococcal conjugate (MenACWY) vaccine  You may need this if you have certain conditions. Hepatitis A vaccine  You  may need this if you have certain conditions or if you travel or work in places where you may be exposed to hepatitis A. Hepatitis B vaccine  You may need this if you have certain conditions or if you travel or work in places where you may be exposed to hepatitis B. Haemophilus influenzae type b (Hib) vaccine  You may need this if you have certain conditions. Human papillomavirus (HPV) vaccine  If recommended by your health care provider, you may need three doses over 6 months. You may receive vaccines as individual doses or as more than one vaccine together in one shot (combination vaccines). Talk with your health care provider about the risks and benefits of combination vaccines. What tests do I need? Blood tests  Lipid and cholesterol levels. These may be checked every 5 years, or more frequently if you are over 36 years old.  Hepatitis C test.  Hepatitis B test. Screening  Lung cancer screening. You may have this screening every year starting at age 28 if you have a 30-pack-year history of smoking and currently smoke or have quit within the past 15 years.  Colorectal cancer screening. All adults should have this screening starting at age 60 and continuing until age 26. Your health care provider may recommend screening at age 59 if you are at increased risk. You will have tests every 1-10 years, depending on your results and the type of screening test.  Diabetes screening. This is done by checking your blood sugar (glucose) after you have not eaten for a while (fasting). You may have this done every 1-3 years.  Mammogram. This may be done every 1-2 years. Talk with your health care provider about when you should start having regular mammograms. This may depend on whether you have a family history of breast cancer.  BRCA-related cancer screening. This may be done if you have a family history of breast, ovarian, tubal, or peritoneal cancers.  Pelvic exam and Pap test. This may be done  every 3 years starting at age 7. Starting at age 54, this may be done every 5 years if you have a Pap test in combination with an HPV test. Other tests  Sexually transmitted disease (STD) testing.  Bone density scan. This is done to screen for osteoporosis. You may have this scan if you are at high risk for osteoporosis. Follow these instructions at home: Eating and drinking  Eat a diet that includes fresh fruits and vegetables, whole grains, lean protein, and low-fat dairy.  Take vitamin and mineral supplements as recommended by your health care provider.  Do not drink alcohol if: ? Your health care provider tells you not to drink. ? You are pregnant, may be pregnant, or are planning to become pregnant.  If you drink alcohol: ? Limit how much you have to 0-1 drink a day. ? Be aware of how much  alcohol is in your drink. In the [place] one drink equals one 12 oz bottle of beer (355 mL), one 5 oz glass of wine (148 mL), or one 1 oz glass of hard liquor (44 mL). Lifestyle  Take daily care of your teeth and gums.  Stay active. Exercise for at least 30 minutes on 5 or more days each week.  Do not use any products that contain nicotine or tobacco, such as cigarettes, e-cigarettes, and chewing tobacco. If you need help quitting, ask your health care provider.  If you are sexually active, practice safe sex. Use a condom or other form of birth control (contraception) in order to prevent pregnancy and STIs (sexually transmitted infections).  If told by your health care provider, take low-dose aspirin daily starting at age 26. What's next?  Visit your health care provider once a year for a well check visit.  Ask your health care provider how often you should have your eyes and teeth checked.  Stay up to date on all vaccines. This information is not intended to replace advice given to you by your health care provider. Make sure you discuss any questions you have with your health care  provider. Document Revised: 11/01/2017 Document Reviewed: 11/01/2017 Elsevier Patient Education  [employer].   Follow-up plan: Return in about 1 year (around 03/31/2020) for annual physical exam.  Clearnce Sorrel, DNP, APRN, FNP-BC [facility]

## 2019-04-01 NOTE — Patient Instructions (Signed)

## 2019-04-14 ENCOUNTER — Encounter: Payer: Self-pay | Admitting: Gastroenterology

## 2019-04-17 ENCOUNTER — Ambulatory Visit: Payer: BC Managed Care – PPO

## 2019-05-08 ENCOUNTER — Ambulatory Visit: Payer: BC Managed Care – PPO | Admitting: Gastroenterology

## 2019-05-30 ENCOUNTER — Encounter: Payer: Self-pay | Admitting: Gastroenterology

## 2019-05-30 ENCOUNTER — Ambulatory Visit (INDEPENDENT_AMBULATORY_CARE_PROVIDER_SITE_OTHER): Payer: BC Managed Care – PPO | Admitting: Gastroenterology

## 2019-05-30 VITALS — BP 110/70 | HR 80 | Temp 98.5°F | Ht 63.25 in | Wt 193.0 lb

## 2019-05-30 DIAGNOSIS — R1013 Epigastric pain: Secondary | ICD-10-CM | POA: Diagnosis not present

## 2019-05-30 DIAGNOSIS — K625 Hemorrhage of anus and rectum: Secondary | ICD-10-CM | POA: Diagnosis not present

## 2019-05-30 DIAGNOSIS — K219 Gastro-esophageal reflux disease without esophagitis: Secondary | ICD-10-CM

## 2019-05-30 DIAGNOSIS — K5904 Chronic idiopathic constipation: Secondary | ICD-10-CM

## 2019-05-30 DIAGNOSIS — K649 Unspecified hemorrhoids: Secondary | ICD-10-CM

## 2019-05-30 DIAGNOSIS — Z0000 Encounter for general adult medical examination without abnormal findings: Secondary | ICD-10-CM | POA: Diagnosis not present

## 2019-05-30 DIAGNOSIS — Z01818 Encounter for other preprocedural examination: Secondary | ICD-10-CM

## 2019-05-30 NOTE — Progress Notes (Signed)
Patricia Griffith    [id number]    Mar 11, 1975  Primary Care Physician:Alexander, Lanelle Bal, DO  Referring Physician: Emeterio Reeve, [address] [place],  [place] [id number]   Chief complaint:  BRBPR, GERD HPI: 44 year old female here for new patient visit for evaluation of rectal bleeding, symptomatic hemorrhoids and chronic constipation  Constipation for past few years, she has tried over-the-counter medications or natural supplements with normal significant improvement.  She has bowel movement every 3 to 4 days She is currently using Miralax, prune juice, increased dietary fiber and water, otc Magnesium  She has had intermittent rectal bleeding and symptomatic hemorrhoids for many years now but started noticing worsening rectal bleeding even without a bowel movement for last 1 to 2 weeks  Maternal grandfather recently diagnosed with colon cancer in his 67s  She has intermittent reflux and heartburn, worse when she is very constipated and also dyspepsia symptoms she tries to manage her symptoms with dietary changes.  She does not like taking any medications.   Outpatient Encounter Medications as of 05/30/2019  Medication Sig  . [DISCONTINUED] hydrocortisone (ANUSOL-HC) 25 MG suppository Place 1 suppository (25 mg total) rectally 2 (two) times daily as needed for hemorrhoids.   No facility-administered encounter medications on file as of 05/30/2019.    Allergies as of 05/30/2019 - Review Complete 05/30/2019  Allergen Reaction Noted  . Sulfa antibiotics Hives and Other (See Comments) 03/11/2013  . Witch hazel Swelling and Rash 12/04/2017    Past Medical History:  Diagnosis Date  . Hemorrhoids   . HSV-2 (herpes simplex virus 2) infection   . Vaginal Pap smear, abnormal     Past Surgical History:  Procedure Laterality Date  . CARDIAC SURGERY     as a child  . CESAREAN SECTION     x1  . COLPOSCOPY    . TUBAL LIGATION      Family History   Problem Relation Age of Onset  . Heart failure Mother   . Diabetes Mother   . Hyperlipidemia Mother   . Congestive Heart Failure Father   . Breast cancer Maternal Grandmother   . Diabetes Maternal Grandmother   . Liver cancer Paternal Grandmother   . Prostate cancer Paternal Grandfather   . Colon cancer Maternal Grandfather     Social History   Socioeconomic History  . Marital status: Single    Spouse name: Not on file  . Number of children: 3  . Years of education: Not on file  . Highest education level: Not on file  Occupational History  . Occupation: call center  . 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 Smoking status: Current Every Day Smoker    Packs/day: 0.50    Years: 18.00    Pack years: 9.00    Types: Cigarettes  . Smokeless tobacco: Never Used  Substance and Sexual Activity  . Alcohol use: Yes    Comment: socially  . Drug use: Never  . Sexual activity: Not Currently    Birth control/protection: Surgical  Other Topics Concern  . Not on file  Social History Narrative  . Not on file   Social Determinants of Health   Financial Resource Strain:   . Difficulty of Paying Living Expenses:   Food Insecurity:   . Worried About Charity fundraiser in the Last Year:   . Arboriculturist in the Last Year:   Transportation Needs:   .  Lack of Transportation (Medical):   Marland Kitchen Lack of Transportation (Non-Medical):   Physical Activity:   . Days of Exercise per Week:   . Minutes of Exercise per Session:   Stress:   . Feeling of Stress :   Social Connections:   . Frequency of Communication with Friends and Family:   . Frequency of Social Gatherings with Friends and Family:   . Attends Religious Services:   . Active Member of Clubs or Organizations:   . Attends Archivist Meetings:   Marland Kitchen Marital Status:   Intimate Partner Violence:   . Fear of Current or Ex-Partner:   . Emotionally Abused:   Marland Kitchen Physically Abused:   . Sexually Abused:       Review  of systems: All other review of systems negative except as mentioned in the HPI.   Physical Exam: Vitals:   05/30/19 1013  Temp: 98.5 F (36.9 C)   Body mass index is 33.92 kg/m. Gen:      No acute distress Abd:      + bowel sounds; soft, non-tender; no palpable masses, no distension Ext:    No edema; adequate peripheral perfusion Neuro: alert and oriented x 3 Psych: normal mood and affect  Data Reviewed:  Reviewed labs, radiology imaging, old records and pertinent past GI work up   Assessment and Plan/Recommendations:  44 year old female with family history of colon cancer in maternal grandfather, chronic constipation, symptomatic hemorrhoids and rectal bleeding  Rectal bleeding likely secondary to hemorrhoids from internal hemorrhoids but will need to exclude colorectal cancer, proctitis or neoplastic lesion Schedule for colonoscopy for further evaluation The risks and benefits as well as alternatives of endoscopic procedure(s) have been discussed and reviewed. All questions answered. The patient agrees to proceed.  Symptomatic hemorrhoids: We will consider internal hemorrhoid band ligation after colonoscopy Avoid excessive straining during defecation Use Anusol suppository per rectum as needed  Chronic idiopathic constipation: Increase dietary fiber and water intake Start taking MiraLAX 1 capful daily and titrate up as needed to have 1-2 soft bowel movements daily  GERD: Antireflux measures and continue to monitor If has persistent symptoms we will consider starting her to blocker or PPI.  Patient is reluctant to start any new medications at this point.  Dyspepsia: Trial of FD guard 1 capsule up to 3 times daily as needed     The patient was provided an opportunity to ask questions and all were answered. The patient agreed with the plan and demonstrated an understanding of the instructions.  Damaris Hippo , MD    CC: Emeterio Reeve, DO

## 2019-05-30 NOTE — Patient Instructions (Signed)
If you are age 42 or older, your body mass index should be between 23-30. Your Body mass index is 33.92 kg/m. If this is out of the aforementioned range listed, please consider follow up with your Primary Care Provider.  If you are age 19 or younger, your body mass index should be between 19-25. Your Body mass index is 33.92 kg/m. If this is out of the aformentioned range listed, please consider follow up with your Primary Care Provider.   You have been scheduled for a colonoscopy. Please follow written instructions given to you at your visit today.   If you use inhalers (even only as needed), please bring them with you on the day of your procedure. Your physician has requested that you go to www.startemmi.com and enter the access code given to you at your visit today. This web site gives a general overview about your procedure. However, you should still follow specific instructions given to you by our office regarding your preparation for the procedure.  You have been given a SUPREP sample.  You have been given samples of FDgard - take one capsule three times daily as needed.  (this is over-the-counter)  Start Miralax 1 capful daily.  (this is over-the-counter)  You have been given constipation handout.  You have been given Anti-Reflux measures handout.  Thank you for choosing me and [facility].   Harl Bowie, MD

## 2019-05-31 LAB — COMPLETE METABOLIC PANEL WITH GFR
AG Ratio: 1.1 (calc) (ref 1.0–2.5)
ALT: 11 U/L (ref 6–29)
AST: 13 U/L (ref 10–30)
Albumin: 3.9 g/dL (ref 3.6–5.1)
Alkaline phosphatase (APISO): 61 U/L (ref 31–125)
BUN: 9 mg/dL (ref 7–25)
CO2: 25 mmol/L (ref 20–32)
Calcium: 9.2 mg/dL (ref 8.6–10.2)
Chloride: 108 mmol/L (ref 98–110)
Creat: 0.6 mg/dL (ref 0.50–1.10)
GFR, Est African American: 130 mL/min/{1.73_m2} (ref >=60)
GFR, Est Non African American: 112 mL/min/{1.73_m2} (ref >=60)
Globulin: 3.7 g/dL (calc) (ref 1.9–3.7)
Glucose, Bld: 81 mg/dL (ref 65–99)
Potassium: 4.7 mmol/L (ref 3.5–5.3)
Sodium: 140 mmol/L (ref 135–146)
Total Bilirubin: 0.3 mg/dL (ref 0.2–1.2)
Total Protein: 7.6 g/dL (ref 6.1–8.1)

## 2019-05-31 LAB — LIPID PANEL
Cholesterol: 167 mg/dL (ref <200)
HDL: 34 mg/dL — ABNORMAL LOW (ref >=50)
LDL Cholesterol (Calc): 116 mg/dL (calc) — ABNORMAL HIGH
Non-HDL Cholesterol (Calc): 133 mg/dL (calc) — ABNORMAL HIGH (ref <130)
Total CHOL/HDL Ratio: 4.9 (calc) (ref <5.0)
Triglycerides: 77 mg/dL (ref <150)

## 2019-05-31 LAB — CBC
HCT: 33.6 % — ABNORMAL LOW (ref 35.0–45.0)
Hemoglobin: 10.5 g/dL — ABNORMAL LOW (ref 11.7–15.5)
MCH: 24 pg — ABNORMAL LOW (ref 27.0–33.0)
MCHC: 31.3 g/dL — ABNORMAL LOW (ref 32.0–36.0)
MCV: 76.7 fL — ABNORMAL LOW (ref 80.0–100.0)
MPV: 10.5 fL (ref 7.5–12.5)
Platelets: 447 10*3/uL — ABNORMAL HIGH (ref 140–400)
RBC: 4.38 10*6/uL (ref 3.80–5.10)
RDW: 17.9 % — ABNORMAL HIGH (ref 11.0–15.0)
WBC: 8.2 10*3/uL (ref 3.8–10.8)

## 2019-05-31 LAB — TSH: TSH: 1.53 mIU/L

## 2019-06-02 ENCOUNTER — Other Ambulatory Visit: Payer: Self-pay | Admitting: Medical-Surgical

## 2019-06-02 DIAGNOSIS — D509 Iron deficiency anemia, unspecified: Secondary | ICD-10-CM

## 2019-06-12 ENCOUNTER — Ambulatory Visit (INDEPENDENT_AMBULATORY_CARE_PROVIDER_SITE_OTHER): Payer: BC Managed Care – PPO

## 2019-06-12 ENCOUNTER — Other Ambulatory Visit: Payer: Self-pay

## 2019-06-12 DIAGNOSIS — Z0000 Encounter for general adult medical examination without abnormal findings: Secondary | ICD-10-CM

## 2019-06-12 DIAGNOSIS — Z1231 Encounter for screening mammogram for malignant neoplasm of breast: Secondary | ICD-10-CM | POA: Diagnosis not present

## 2019-06-16 ENCOUNTER — Ambulatory Visit (INDEPENDENT_AMBULATORY_CARE_PROVIDER_SITE_OTHER): Payer: BC Managed Care – PPO

## 2019-06-16 ENCOUNTER — Other Ambulatory Visit: Payer: Self-pay

## 2019-06-16 DIAGNOSIS — Z1159 Encounter for screening for other viral diseases: Secondary | ICD-10-CM

## 2019-06-17 ENCOUNTER — Telehealth: Payer: Self-pay

## 2019-06-17 ENCOUNTER — Other Ambulatory Visit: Payer: Self-pay | Admitting: Gastroenterology

## 2019-06-17 DIAGNOSIS — Z1159 Encounter for screening for other viral diseases: Secondary | ICD-10-CM | POA: Diagnosis not present

## 2019-06-17 LAB — SARS CORONAVIRUS 2 (TAT 6-24 HRS): SARS Coronavirus 2: NEGATIVE

## 2019-06-17 NOTE — Telephone Encounter (Signed)
Ok thank you 

## 2019-06-17 NOTE — Telephone Encounter (Signed)
Called patient to see if she could come in early. Pt stated that she ate lunch at 1:30 today. She stated that she ate mac and cheese, and green beans. Left pt appointment time as scheduled, and advised patient to stop eating and be on clear liquids for the rest of the evening until after her appointment. Went over instructions with patient again. Pt verbalized understanding.

## 2019-06-18 ENCOUNTER — Other Ambulatory Visit: Payer: Self-pay

## 2019-06-18 ENCOUNTER — Encounter: Payer: Self-pay | Admitting: Gastroenterology

## 2019-06-18 ENCOUNTER — Ambulatory Visit (AMBULATORY_SURGERY_CENTER): Payer: BC Managed Care – PPO | Admitting: Gastroenterology

## 2019-06-18 VITALS — BP 96/60 | HR 69 | Temp 97.8°F | Resp 19 | Ht 63.0 in | Wt 193.0 lb

## 2019-06-18 DIAGNOSIS — K5289 Other specified noninfective gastroenteritis and colitis: Secondary | ICD-10-CM

## 2019-06-18 DIAGNOSIS — K633 Ulcer of intestine: Secondary | ICD-10-CM | POA: Diagnosis not present

## 2019-06-18 DIAGNOSIS — K621 Rectal polyp: Secondary | ICD-10-CM | POA: Diagnosis not present

## 2019-06-18 DIAGNOSIS — K625 Hemorrhage of anus and rectum: Secondary | ICD-10-CM

## 2019-06-18 DIAGNOSIS — D128 Benign neoplasm of rectum: Secondary | ICD-10-CM

## 2019-06-18 MED ORDER — HYDROCORTISONE ACETATE 25 MG RE SUPP: 25.0000 mg | Freq: Every day | RECTAL | 1 refills | Status: DC | PRN

## 2019-06-18 MED ORDER — SODIUM CHLORIDE 0.9 % IV SOLN: 500.0000 mL | Freq: Once | INTRAVENOUS | Status: DC

## 2019-06-18 NOTE — Op Note (Addendum)
[facility] Patient Name: Patricia Griffith Procedure Date: 06/18/2019 1:38 PM MRN: [id number] Endoscopist: Mauri Pole , MD Age: 44 Referring MD:  Date of Birth: 1975-08-16 Gender: Female Account #: [id number] Procedure:                Colonoscopy Indications:              Evaluation of unexplained GI bleeding presenting                            with Hematochezia Medicines:                Monitored Anesthesia Care Procedure:                Pre-Anesthesia Assessment:                           - Prior to the procedure, a History and Physical                            was performed, and patient medications and                            allergies were reviewed. The patient's tolerance of                            previous anesthesia was also reviewed. The risks                            and benefits of the procedure and the sedation                            options and risks were discussed with the patient.                            All questions were answered, and informed consent                            was obtained. Prior Anticoagulants: The patient has                            taken no previous anticoagulant or antiplatelet                            agents. ASA Grade Assessment: II - A patient with                            mild systemic disease. After reviewing the risks                            and benefits, the patient was deemed in                            satisfactory condition to undergo the procedure.  After obtaining informed consent, the colonoscope                            was passed under direct vision. Throughout the                            procedure, the patient's blood pressure, pulse, and                            oxygen saturations were monitored continuously. The                            Colonoscope was introduced through the anus and                            advanced to the the cecum, identified  by                            appendiceal orifice and ileocecal valve. The                            colonoscopy was performed without difficulty. The                            patient tolerated the procedure well. The quality                            of the bowel preparation was excellent. The                            ileocecal valve, appendiceal orifice, and rectum                            were photographed. Scope In: 1:39:43 PM Scope Out: 1:54:42 PM Scope Withdrawal Time: 0 hours 12 minutes 15 seconds  Total Procedure Duration: 0 hours 14 minutes 59 seconds  Findings:                 The perianal and digital rectal examinations were                            normal.                           A 5 mm polyp was found in the rectum. The polyp was                            sessile. The polyp was removed with a hot snare.                            Resection and retrieval were complete.                           Non-bleeding erythematous, inflammed internal  hemorrhoids were found during retroflexion. The                            hemorrhoids were large.                           Normal mucosa was found in the entire colon.                            Biopsies were taken with a cold forceps for                            histology from Right and Left colon.                           The terminal ileum contained a few one mm ulcers.                            No bleeding was present. Biopsies were taken with a                            cold forceps for histology. Complications:            No immediate complications. Estimated Blood Loss:     Estimated blood loss was minimal. Impression:               - One 5 mm polyp in the rectum, removed with a hot                            snare. Resected and retrieved.                           - Non-bleeding internal hemorrhoids.                           - Normal mucosa in the entire examined colon.                             Biopsied.                           - A few ulcers in the terminal ileum. Biopsied. Recommendation:           - Patient has a contact number available for                            emergencies. The signs and symptoms of potential                            delayed complications were discussed with the                            patient. Return to normal activities tomorrow.                            Written  discharge instructions were provided to the                            patient.                           - Resume previous diet.                           - Continue present medications.                           - Await pathology results.                           - Repeat colonoscopy in 5-10 years for surveillance                            based on pathology results.                           - Use hydrocortisone suppository 25 mg 1 per rectum                            once a day PRN X7 days. Please send Rx for 12 with                            1 refill                           - Use Benefiber two teaspoons PO TID.                           - Return to GI office at the next available                            appointment for hemorrhoidal band ligation. Please                            call [phone] to schedule appointment.                           - No aspirin, ibuprofen, naproxen, or other                            non-steroidal anti-inflammatory drugs. Mauri Pole, MD 06/18/2019 2:05:05 PM This report has been signed electronically.

## 2019-06-18 NOTE — Patient Instructions (Signed)
HANDOUTS PROVIDED ON: POLYPS & HEMORRHOIDS  The polyp removed/biopsies taken today have been sent for pathology.  The results can take 1-3 weeks to receive.  When your next colonoscopy should occur will be based on the pathology results.    You may resume your previous diet and medication schedule.  Use 2 tsp of Benefiber by mouth 3 times a day.  Hydrocortisone suppository 25 mg per rectum once a day as needed for the next 7 days.  Thank you for allowing [place] to care for you today!!!   YOU HAD AN ENDOSCOPIC PROCEDURE TODAY AT [facility]:   Refer to the procedure report that was given to you for any specific questions about what was found during the examination.  If the procedure report does not answer your questions, please call your gastroenterologist to clarify.  If you requested that your care partner not be given the details of your procedure findings, then the procedure report has been included in a sealed envelope for you to review at your convenience later.  YOU SHOULD EXPECT: Some feelings of bloating in the abdomen. Passage of more gas than usual.  Walking can help get rid of the air that was put into your GI tract during the procedure and reduce the bloating. If you had a lower endoscopy (such as a colonoscopy or flexible sigmoidoscopy) you may notice spotting of blood in your stool or on the toilet paper. If you underwent a bowel prep for your procedure, you may not have a normal bowel movement for a few days.  Please Note:  You might notice some irritation and congestion in your nose or some drainage.  This is from the oxygen used during your procedure.  There is no need for concern and it should clear up in a day or so.  SYMPTOMS TO REPORT IMMEDIATELY:   Following lower endoscopy (colonoscopy or flexible sigmoidoscopy):  Excessive amounts of blood in the stool  Significant tenderness or worsening of abdominal pains  Swelling of the abdomen that is new,  acute  Fever of 100F or higher  For urgent or emergent issues, a gastroenterologist can be reached at any hour by calling [phone]. Do not use MyChart messaging for urgent concerns.    DIET:  We do recommend a small meal at first, but then you may proceed to your regular diet.  Drink plenty of fluids but you should avoid alcoholic beverages for 24 hours.  ACTIVITY:  You should plan to take it easy for the rest of today and you should NOT DRIVE or use heavy machinery until tomorrow (because of the sedation medicines used during the test).    FOLLOW UP: Our staff will call the number listed on your records 48-72 hours following your procedure to check on you and address any questions or concerns that you may have regarding the information given to you following your procedure. If we do not reach you, we will leave a message.  We will attempt to reach you two times.  During this call, we will ask if you have developed any symptoms of COVID 19. If you develop any symptoms (ie: fever, flu-like symptoms, shortness of breath, cough etc.) before then, please call [phone].  If you test positive for Covid 19 in the 2 weeks post procedure, please call and report this information to [place].    If any biopsies were taken you will be contacted by phone or by letter within the next 1-3 weeks.  Please call  us at [phone] if you have not heard about the biopsies in 3 weeks.    SIGNATURES/CONFIDENTIALITY: You and/or your care partner have signed paperwork which will be entered into your electronic medical record.  These signatures attest to the fact that that the information above on your After Visit Summary has been reviewed and is understood.  Full responsibility of the confidentiality of this discharge information lies with you and/or your care-partner.

## 2019-06-18 NOTE — Progress Notes (Signed)
DT- vitals JB- temp 

## 2019-06-18 NOTE — Progress Notes (Signed)
Report given to PACU, vss 

## 2019-06-18 NOTE — Progress Notes (Signed)
Called to room to assist during endoscopic procedure.  Patient ID and intended procedure confirmed with present staff. Received instructions for my participation in the procedure from the performing physician.  

## 2019-06-20 ENCOUNTER — Telehealth: Payer: Self-pay | Admitting: *Deleted

## 2019-06-20 NOTE — Telephone Encounter (Signed)
  Follow up Call-  Call back number 06/18/2019  Post procedure Call Back phone  # [phone]  Permission to leave phone message Yes     Patient questions:  Do you have a fever, pain , or abdominal swelling? No. Pain Score  0 *  Have you tolerated food without any problems? Yes.    Have you been able to return to your normal activities? Yes.    Do you have any questions about your discharge instructions: Diet   No. Medications  No. Follow up visit  No.  Do you have questions or concerns about your Care? No.  Actions: * If pain score is 4 or above: No action needed, pain <4.   1. Have you developed a fever since your procedure? no  2.   Have you had an respiratory symptoms (SOB or cough) since your procedure? no  3.   Have you tested positive for COVID 19 since your procedure no  4.   Have you had any family members/close contacts diagnosed with the COVID 19 since your procedure?  no   If yes to any of these questions please route to Joylene John, RN and Erenest Rasher, RN

## 2019-06-20 NOTE — Telephone Encounter (Signed)
Follow up call made, no answer, left message 

## 2019-06-24 ENCOUNTER — Encounter: Payer: Self-pay | Admitting: Gastroenterology

## 2019-07-09 IMAGING — MG DIGITAL SCREENING BILATERAL MAMMOGRAM WITH TOMO AND CAD
4 series · 4 of 12 positions shown · non-contrast
Comparison: None.

CLINICAL DATA: Screening.

EXAM:
DIGITAL SCREENING BILATERAL MAMMOGRAM WITH TOMO AND CAD

[R MLO synth-2D]
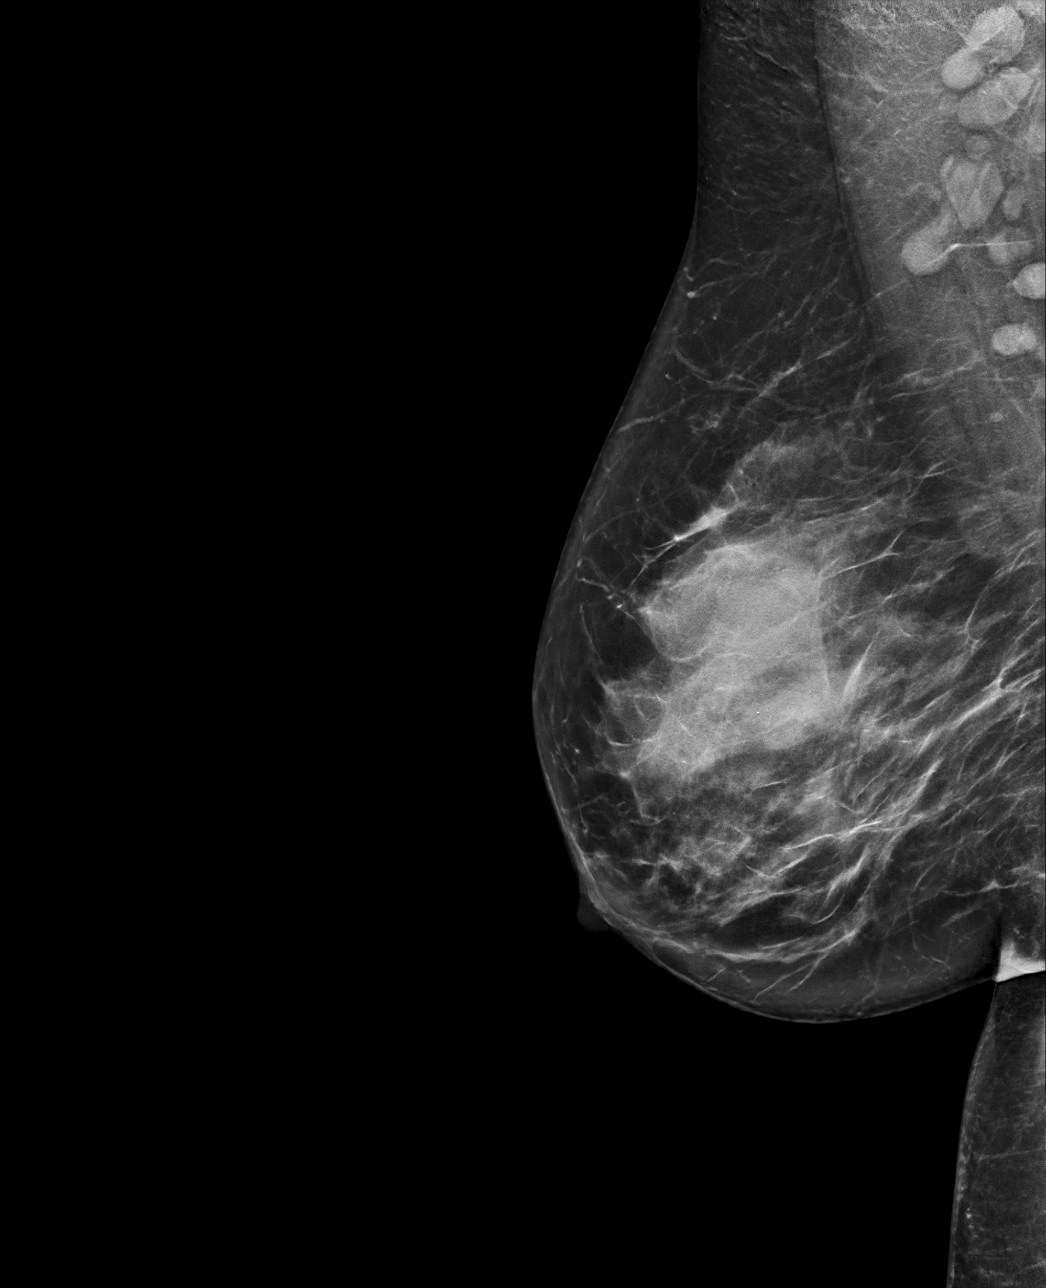

[L MLO synth-2D]
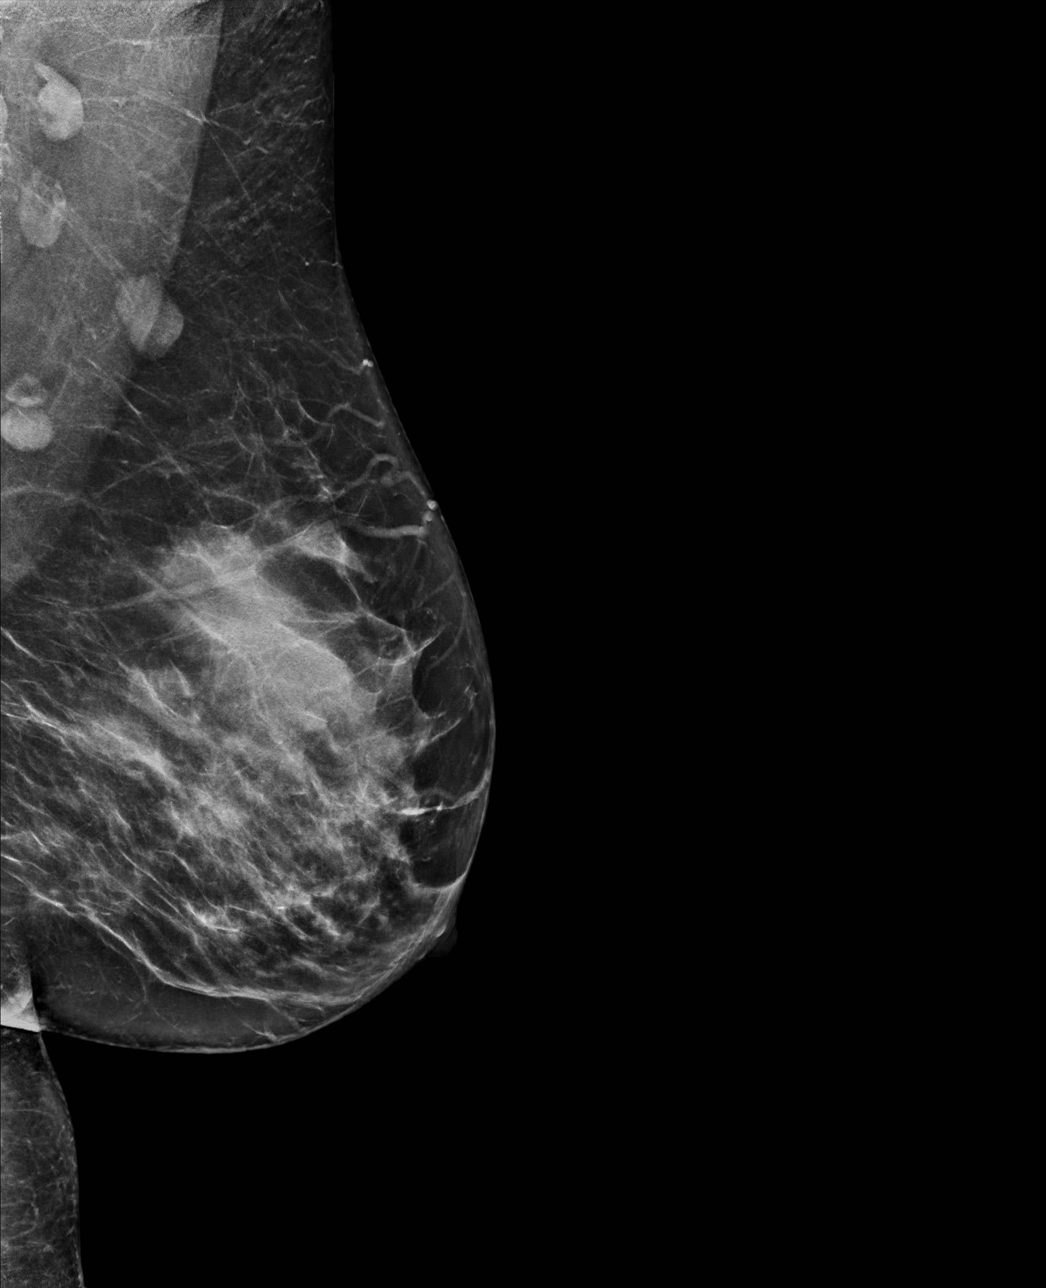

[L MLO tomo · tomo slice 39/76.0]
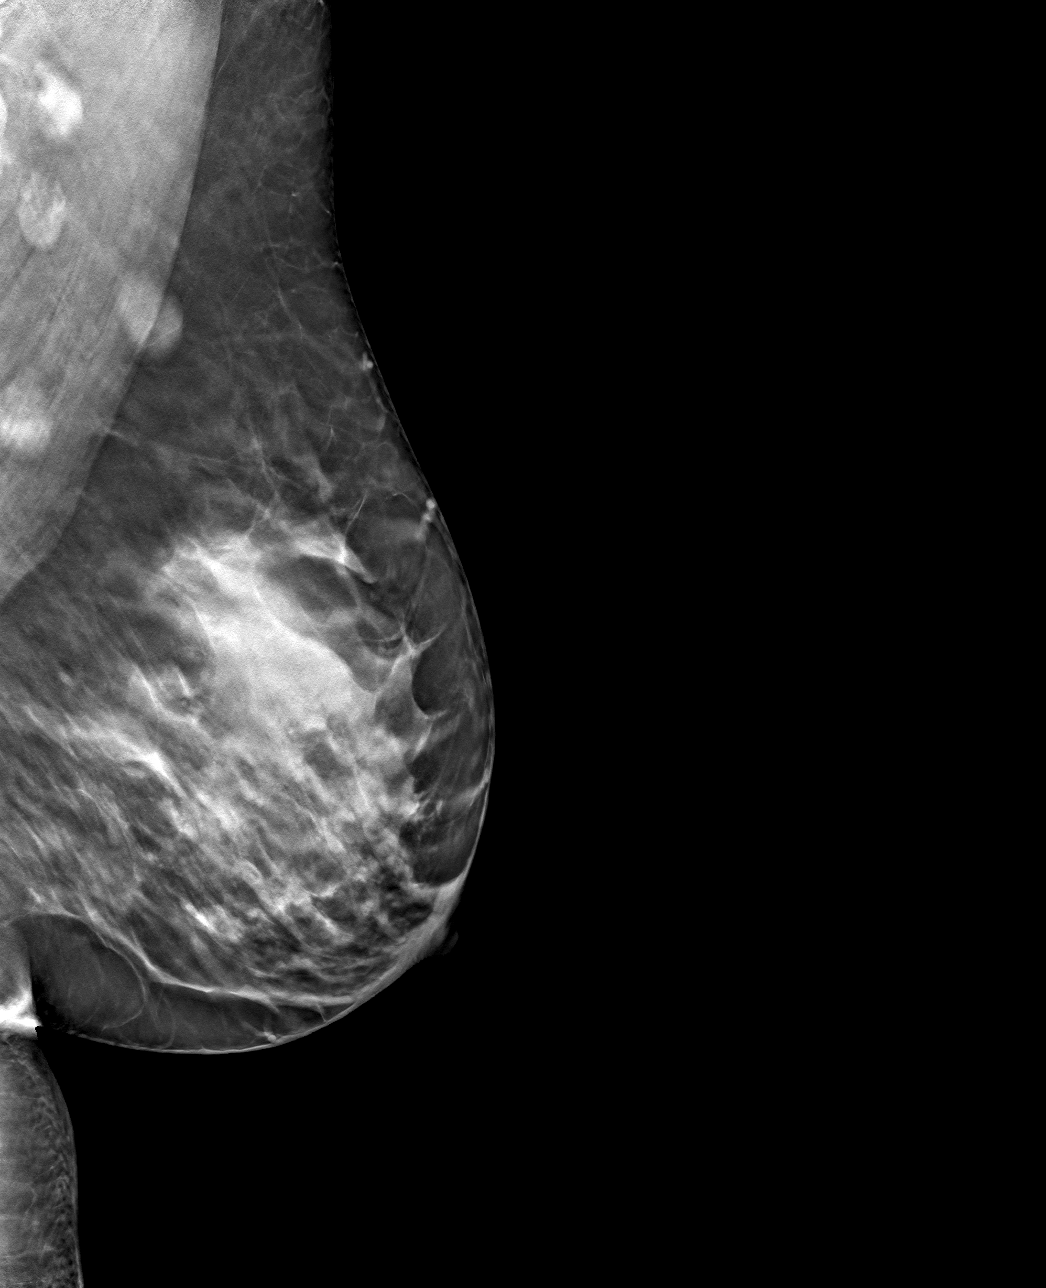

[R MLO tomo · tomo slice 42/83.0]
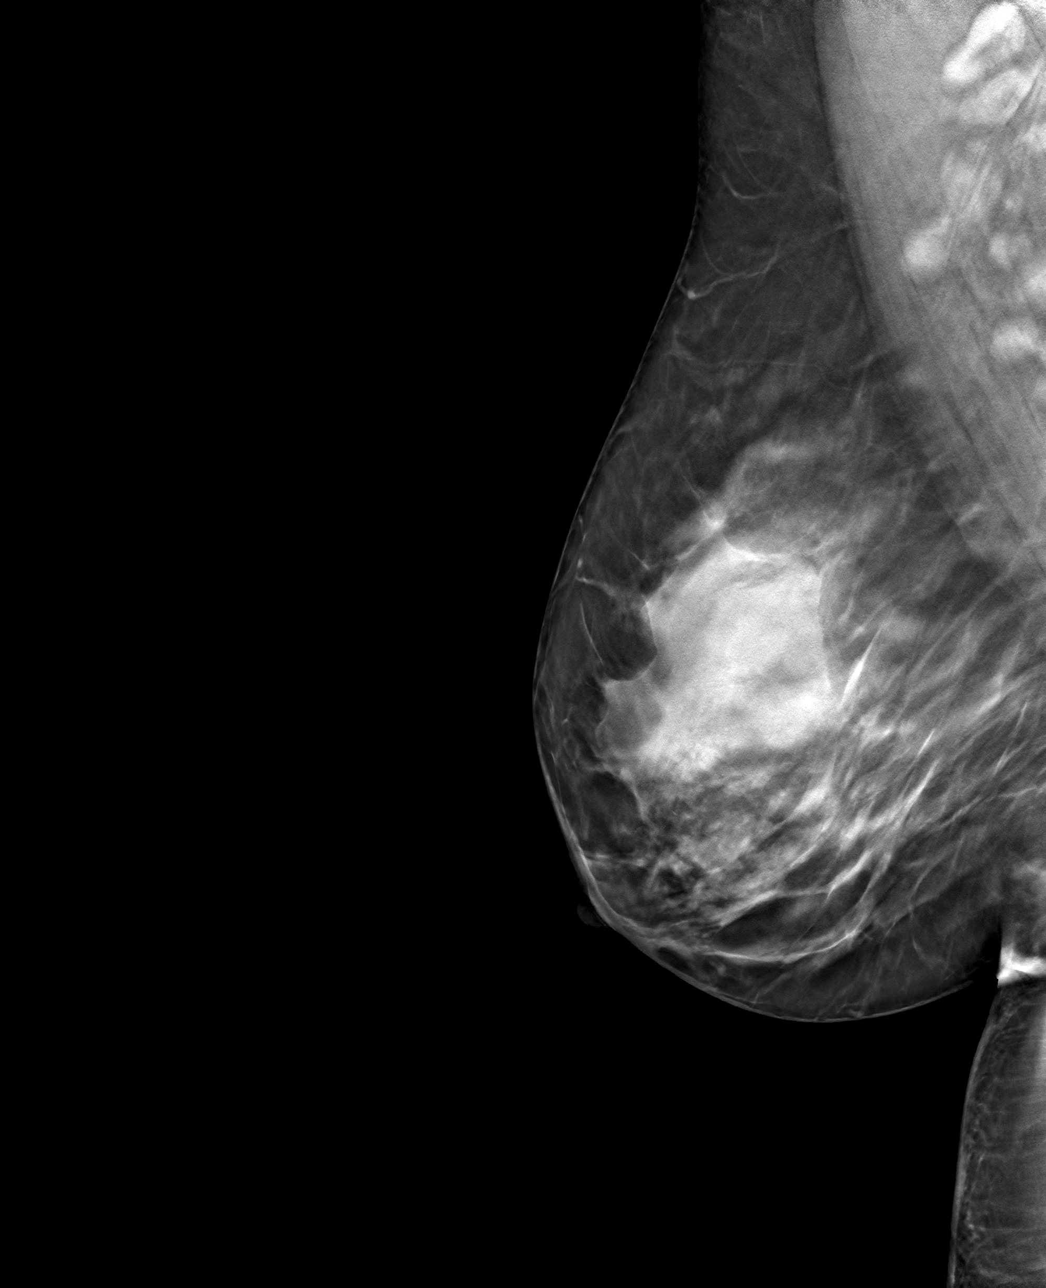

[4 of 12 positions shown; findings below may reference images not displayed]

ACR Breast Density Category c: The breast tissue is heterogeneously
dense, which may obscure small masses.
FINDINGS: In the right breast possible right axillary lymphadenopathy requires
further evaluation.

In the left breast possible left axillary lymphadenopathy requires
further evaluation.

Images were processed with CAD.
IMPRESSION: Further evaluation with ultrasound for possible bilateral axillary
lymphadenopathy is recommended.

RECOMMENDATION:
Bilateral axillary ultrasound.  (Code:[id number])

The patient will be contacted regarding the findings, and additional
imaging will be scheduled.

BI-RADS CATEGORY  0: Incomplete. Need additional imaging evaluation
and/or prior mammograms for comparison.

## 2019-09-23 ENCOUNTER — Telehealth: Payer: Self-pay | Admitting: Gastroenterology

## 2019-09-23 NOTE — Telephone Encounter (Signed)
Scheduled patient for the banding but she has questions on procedure please advise.

## 2019-09-23 NOTE — Telephone Encounter (Signed)
Patricia Griffith, It is okay to schedule her for the next available Hem band opening on Dr Woodward Ku schedule. She had her colonoscopy in April. Dr Silverio Decamp has approved the appointment. Thanks

## 2019-09-23 NOTE — Telephone Encounter (Signed)
Left message for patient to call back and schedule hemorrhoid banding with Dr. Silverio Decamp.

## 2019-09-24 NOTE — Telephone Encounter (Signed)
Called the patient back. No answer. Left a message of my returning her call on her voicemail.

## 2019-09-26 NOTE — Telephone Encounter (Signed)
Briefly explained the process to the patient. No prep. No sedation, so no driver needed. She can also check the web site [email or web address] and get further information. Very nice patient. Thanked me for the call.

## 2019-11-12 ENCOUNTER — Ambulatory Visit (INDEPENDENT_AMBULATORY_CARE_PROVIDER_SITE_OTHER): Payer: BC Managed Care – PPO | Admitting: Gastroenterology

## 2019-11-12 ENCOUNTER — Encounter: Payer: Self-pay | Admitting: Gastroenterology

## 2019-11-12 VITALS — BP 108/62 | HR 80 | Ht 63.25 in | Wt 191.1 lb

## 2019-11-12 DIAGNOSIS — K602 Anal fissure, unspecified: Secondary | ICD-10-CM | POA: Diagnosis not present

## 2019-11-12 DIAGNOSIS — K649 Unspecified hemorrhoids: Secondary | ICD-10-CM

## 2019-11-12 MED ORDER — AMBULATORY NON FORMULARY MEDICATION: 1 refills | Status: DC

## 2019-11-12 NOTE — Progress Notes (Signed)
PROCEDURE NOTE: The patient presents with symptomatic grade II-III  hemorrhoids, requesting rubber band ligation of his/her hemorrhoidal disease.  All risks, benefits and alternative forms of therapy were described and informed consent was obtained.  In the Left Lateral Decubitus position anoscopic examination revealed grade II hemorrhoids in the Right anterior, right posterior and left lateral position(s).   Anal fissure in the right anterior The anorectum was pre-medicated with 0.125% NTG and Recticare The decision was made to band the right posterior internal hemorrhoid, and the Renovo was used to perform band ligation without complication.  Digital anorectal examination was then performed to assure proper positioning of the band, and to adjust the banded tissue as required.  The patient was discharged home without pain or other issues.  Dietary and behavioral recommendations were given and along with follow-up instructions.     The following adjunctive treatments were recommended:  Benefiber 1 tablespoon 3 times daily Nitroglycerin 0.125% 3 times daily, apply small pea-sized amount per rectum for 6 to 8 weeks  The patient will return in 8 weeks for  follow-up and possible additional banding as required. No complications were encountered and the patient tolerated the procedure well.  Damaris Hippo , MD [phone]

## 2019-11-12 NOTE — Patient Instructions (Addendum)
Anal Fissure, Adult  An anal fissure is a small tear or crack in the tissue around the opening of the butt (anus). Bleeding from the tear or crack usually stops on its own within a few minutes. The bleeding may happen every time you poop (have a bowel movement) until the tear or crack heals. What are the causes? This condition is usually caused by passing a large or hard poop (stool). Other causes include:  Trouble pooping (constipation).  Passing watery poop (diarrhea).  Inflammatory bowel disease (Crohn's disease or ulcerative colitis).  Childbirth.  Infections.  Anal sex. What are the signs or symptoms? Symptoms of this condition include:  Bleeding from the butt.  Small amounts of blood on your poop. The blood coats the outside of the poop. It is not mixed with the poop.  Small amounts of blood on the toilet paper or in the toilet after you poop.  Pain when passing poop.  Itching or irritation around the opening of the butt. How is this diagnosed? This condition may be diagnosed based on a physical exam. Your doctor may:  Check your butt. A tear can often be seen by checking the area with care.  Check your butt using a short tube (anoscope). The light in the tube will show any problems in your butt. How is this treated? Treatment for this condition may include:  Treating problems that make it hard for you to pass poop. You may be told to: ? Eat more fiber. ? Drink more fluid. ? Take fiber supplements. ? Take medicines that make poop soft.  Taking sitz baths. This may help to heal the tear.  Using creams and ointments. If your condition gets worse, other treatments may be needed such as:  A shot near the tear or crack (botulinum injection).  Surgery to repair the tear or crack. Follow these instructions at home: Eating and drinking   Avoid bananas and dairy products. These foods can make it hard to poop.  Drink enough fluid to keep your pee (urine) pale  yellow.  Eat foods that have a lot of fiber in them, such as: ? Beans. ? Whole grains. ? Fresh fruits. ? Fresh vegetables. General instructions   Take over-the-counter and prescription medicines only as told by your doctor.  Use creams or ointments only as told by your doctor.  Keep the butt area as clean and dry as you can.  Take a warm water bath (sitz bath) as told by your doctor. Do not use soap.  Keep all follow-up visits as told by your doctor. This is important. Contact a doctor if:  You have more bleeding.  You have a fever.  You have watery poop that is mixed with blood.  You have pain.  Your problem gets worse, not better. Summary  An anal fissure is a small tear or crack in the skin around the opening of the butt (anus).  This condition is usually caused by passing a large or hard poop (stool).  Treatment includes treating the problems that make it hard for you to pass poop.  Follow your doctor's instructions about caring for your condition at home.  Keep all follow-up visits as told by your doctor. This is important. This information is not intended to replace advice given to you by your health care provider. Make sure you discuss any questions you have with your health care provider. Document Revised: 08/02/2017 Document Reviewed: 08/02/2017 Elsevier Patient Education  [employer]  PROCEDURE    FOLLOW-UP CARE   1. The procedure you have had should have been relatively painless since the banding of the area involved does not have nerve endings and there is no pain sensation.  The rubber band cuts off the blood supply to the hemorrhoid and the band may fall off as soon as 48 hours after the banding (the band may occasionally be seen in the toilet bowl following a bowel movement). You may notice a temporary feeling of fullness in the rectum which should respond adequately to plain Tylenol or Motrin.  2. Following the  banding, avoid strenuous exercise that evening and resume full activity the next day.  A sitz bath (soaking in a warm tub) or bidet is soothing, and can be useful for cleansing the area after bowel movements.     3. To avoid constipation, take two tablespoons of natural wheat bran, natural oat bran, flax, Benefiber or any over the counter fiber supplement and increase your water intake to 7-8 glasses daily.    4. Unless you have been prescribed anorectal medication, do not put anything inside your rectum for two weeks: No suppositories, enemas, fingers, etc.  5. Occasionally, you may have more bleeding than usual after the banding procedure.  This is often from the untreated hemorrhoids rather than the treated one.  Dont be concerned if there is a tablespoon or so of blood.  If there is more blood than this, lie flat with your bottom higher than your head and apply an ice pack to the area. If the bleeding does not stop within a half an hour or if you feel faint, call our office at [phone] or go to the emergency room.  6. Problems are not common; however, if there is a substantial amount of bleeding, severe pain, chills, fever or difficulty passing urine (very rare) or other problems, you should call us at [phone] or report to the nearest emergency room.  7. Do not stay seated continuously for more than 2-3 hours for a day or two after the procedure.  Tighten your buttock muscles 10-15 times every two hours and take 10-15 deep breaths every 1-2 hours.  Do not spend more than a few minutes on the toilet if you cannot empty your bowel; instead re-visit the toilet at a later time.    We have sent a prescription for nitroglycerin 0.125% gel to [facility]. You should apply a pea size amount to your rectum three times daily x 6-8 weeks.  [facility]'s information is below: Address: [address] [place], [place] [address]  [phone]  *Please DO NOT go  directly from our office to pick up this medication! Give the pharmacy 1 day to process the prescription as this is compounded and takes time to make.  I appreciate the  opportunity to care for you  Thank You   Harl Bowie , MD

## 2019-11-15 DIAGNOSIS — R519 Headache, unspecified: Secondary | ICD-10-CM | POA: Diagnosis not present

## 2019-11-15 DIAGNOSIS — R030 Elevated blood-pressure reading, without diagnosis of hypertension: Secondary | ICD-10-CM | POA: Diagnosis not present

## 2019-11-15 DIAGNOSIS — F17210 Nicotine dependence, cigarettes, uncomplicated: Secondary | ICD-10-CM | POA: Diagnosis not present

## 2019-11-15 DIAGNOSIS — Z882 Allergy status to sulfonamides status: Secondary | ICD-10-CM | POA: Diagnosis not present

## 2020-01-14 ENCOUNTER — Ambulatory Visit (INDEPENDENT_AMBULATORY_CARE_PROVIDER_SITE_OTHER): Payer: BC Managed Care – PPO | Admitting: Osteopathic Medicine

## 2020-01-14 ENCOUNTER — Other Ambulatory Visit: Payer: Self-pay

## 2020-01-14 ENCOUNTER — Ambulatory Visit (INDEPENDENT_AMBULATORY_CARE_PROVIDER_SITE_OTHER): Payer: BC Managed Care – PPO

## 2020-01-14 ENCOUNTER — Encounter: Payer: Self-pay | Admitting: Osteopathic Medicine

## 2020-01-14 VITALS — BP 149/77 | HR 85 | Ht 63.25 in | Wt 189.0 lb

## 2020-01-14 DIAGNOSIS — M5412 Radiculopathy, cervical region: Secondary | ICD-10-CM | POA: Diagnosis not present

## 2020-01-14 DIAGNOSIS — M62838 Other muscle spasm: Secondary | ICD-10-CM

## 2020-01-14 DIAGNOSIS — M47812 Spondylosis without myelopathy or radiculopathy, cervical region: Secondary | ICD-10-CM | POA: Diagnosis not present

## 2020-01-14 DIAGNOSIS — M4802 Spinal stenosis, cervical region: Secondary | ICD-10-CM | POA: Diagnosis not present

## 2020-01-14 DIAGNOSIS — G5712 Meralgia paresthetica, left lower limb: Secondary | ICD-10-CM

## 2020-01-14 MED ORDER — NAPROXEN 375 MG PO TABS: 375.0000 mg | ORAL_TABLET | Freq: Three times a day (TID) | ORAL | 0 refills | Status: DC

## 2020-01-14 MED ORDER — PREDNISONE 20 MG PO TABS: 20.0000 mg | ORAL_TABLET | Freq: Two times a day (BID) | ORAL | 0 refills | Status: DC

## 2020-01-14 NOTE — Patient Instructions (Addendum)
Plan: Xray today See printed instructions for home exercises / stretches Prescriptions:  Steroids (predisone) burst for 5 days  Anti-inflammatory (meloxicam) take daily for 5-7 days then can keep on had for use as-needed after that If not better / if worse, please let me know and I can refer for formal physical therapy Please contact our office for sports medicine appointment with Dr T if PT not helping      Cervical Radiculopathy  Cervical radiculopathy happens when a nerve in the neck (a cervical nerve) is pinched or bruised. This condition can happen because of an injury to the cervical spine (vertebrae) in the neck, or as part of the normal aging process. Pressure on the cervical nerves can cause pain or numbness that travels from the neck all the way down into the arm and fingers. Usually, this condition gets better with rest. Treatment may be needed if the condition does not improve. What are the causes? This condition may be caused by:  A neck injury.  A bulging (herniated) disk.  Muscle spasms.  Muscle tightness in the neck because of overuse.  Arthritis.  Breakdown or degeneration in the bones and joints of the spine (spondylosis) due to aging.  Bone spurs that may develop near the cervical nerves. What are the signs or symptoms? Symptoms of this condition include:  Pain. The pain may travel from the neck to the arm and hand. The pain can be severe or irritating. It may be worse when you move your neck.  Numbness or tingling in your arm or hand.  Weakness in the affected arm and hand, in severe cases. How is this diagnosed? This condition may be diagnosed based on your symptoms, your medical history, and a physical exam. You may also have tests, including:  X-rays.  A CT scan.  An MRI.  An electromyogram (EMG).  Nerve conduction tests. How is this treated? In many cases, treatment is not needed for this condition. With rest, the condition usually gets  better over time. If treatment is needed, options may include:  Wearing a soft neck collar (cervical collar) for short periods of time, as told by your health care provider.  Doing physical therapy to strengthen your neck muscles.  Taking medicines, such as NSAIDs or oral corticosteroids.  Having spinal injections, in severe cases.  Having surgery. This may be needed if other treatments do not help. Different types of surgery may be done depending on the cause of this condition. Follow these instructions at home: If you have a cervical collar:  Wear it as told by your health care provider. Remove it only as told by your health care provider.  Ask your health care provider if you can remove the collar for cleaning and bathing. If you are allowed to remove the collar for cleaning or bathing: ? Follow instructions from your health care provider about how to remove the collar safely. ? Clean the collar by wiping it with mild soap and water and drying it completely. ? Take out any removable pads in the collar every 1-2 days, and wash them by hand with soap and water. Let them air-dry completely before you put them back in the collar. ? Check your skin under the collar for irritation or sores. If you see any, tell your health care provider. Managing pain      Take over-the-counter and prescription medicines only as told by your health care provider.  If directed, put ice on the affected area. ? If you  have a soft neck collar, remove it as told by your health care provider. ? Put ice in a plastic bag. ? Place a towel between your skin and the bag. ? Leave the ice on for 20 minutes, 2-3 times a day.  If applying ice does not help, you can try using heat. Use the heat source that your health care provider recommends, such as a moist heat pack or a heating pad. ? Place a towel between your skin and the heat source. ? Leave the heat on for 20-30 minutes. ? Remove the heat if your skin turns  bright red. This is especially important if you are unable to feel pain, heat, or cold. You may have a greater risk of getting burned.  Try a gentle neck and shoulder massage to help relieve symptoms. Activity  Rest as needed.  Return to your normal activities as told by your health care provider. Ask your health care provider what activities are safe for you.  Do stretching and strengthening exercises as told by your health care provider or physical therapist.  Do not lift anything that is heavier than 10 lb (4.5 kg) until your health care provider tells you that it is safe. General instructions  Use a flat pillow when you sleep.  Do not drive while wearing a cervical collar. If you do not have a cervical collar, ask your health care provider if it is safe to drive while your neck heals.  Ask your health care provider if the medicine prescribed to you requires you to avoid driving or using heavy machinery.  Do not use any products that contain nicotine or tobacco, such as cigarettes, e-cigarettes, and chewing tobacco. These can delay healing. If you need help quitting, ask your health care provider.  Keep all follow-up visits as told by your health care provider. This is important. Contact a health care provider if:  Your condition does not improve with treatment. Get help right away if:  Your pain gets much worse and cannot be controlled with medicines.  You have weakness or numbness in your hand, arm, face, or leg.  You have a high fever.  You have a stiff, rigid neck.  You lose control of your bowels or your bladder (have incontinence).  You have trouble with walking, balance, or speaking. Summary  Cervical radiculopathy happens when a nerve in the neck is pinched or bruised.  A nerve can get pinched from a bulging disk, arthritis, muscle spasms, or an injury to the neck.  Symptoms include pain, tingling, or numbness radiating from the neck into the arm or hand.  Weakness can also occur in severe cases.  Treatment may include rest, wearing a cervical collar, and physical therapy. Medicines may be prescribed to help with pain. In severe cases, injections or surgery may be needed. This information is not intended to replace advice given to you by your health care provider. Make sure you discuss any questions you have with your health care provider. Document Revised: 01/11/2018 Document Reviewed: 01/11/2018 Elsevier Patient Education  2020 [employer].

## 2020-01-14 NOTE — Progress Notes (Signed)
Patricia Griffith is a 44 y.o. female who presents to  [place] at [facility]  today, 01/14/20, seeking care for the following:   R arm "electric shock" type sensation, fingertips feeling cold. Ongoing a few weeks   Records reviewed: C-spine Xray 03/11/2013 "FINDINGS: Loss of lordosis suggesting muscle spasm. No fracture, dislocation, or prevertebral soft tissue swelling. The disc spaces are preserved and the intervertebral foramina are patent."  On exam, (+)Spurling's to the right reproduces paresthesia down lateral arm and into fingers   . L anterior thigh numbness, reports (+)Hx sciatica but this feels different. Worse w/ sitting/leaning forward No low back pain o Records reviewed: L-spine Xray 03/11/2013 "FINDINGS: Mild dextroscoliosis. The disc spaces are preserved. No fracture, dislocation, or destructive lesion. "     ASSESSMENT & PLAN with other pertinent findings:  The primary encounter diagnosis was Cervical radiculopathy. Diagnoses of Meralgia paresthetica of left side and Muscle spasm were also pertinent to this visit.     Patient Instructions  Plan: Xray today See printed instructions for home exercises / stretches Prescriptions:  Steroids (predisone) burst for 5 days  Anti-inflammatory (meloxicam) take daily for 5-7 days then can keep on had for use as-needed after that If not better / if worse, please let me know and I can refer for formal physical therapy Please contact our office for sports medicine appointment with Dr T if PT not helping      Cervical Radiculopathy  Cervical radiculopathy happens when a nerve in the neck (a cervical nerve) is pinched or bruised. This condition can happen because of an injury to the cervical spine (vertebrae) in the neck, or as part of the normal aging process. Pressure on the cervical nerves can cause pain or numbness that travels from the neck all the way down into the arm and  fingers. Usually, this condition gets better with rest. Treatment may be needed if the condition does not improve. What are the causes? This condition may be caused by:  A neck injury.  A bulging (herniated) disk.  Muscle spasms.  Muscle tightness in the neck because of overuse.  Arthritis.  Breakdown or degeneration in the bones and joints of the spine (spondylosis) due to aging.  Bone spurs that may develop near the cervical nerves. What are the signs or symptoms? Symptoms of this condition include:  Pain. The pain may travel from the neck to the arm and hand. The pain can be severe or irritating. It may be worse when you move your neck.  Numbness or tingling in your arm or hand.  Weakness in the affected arm and hand, in severe cases. How is this diagnosed? This condition may be diagnosed based on your symptoms, your medical history, and a physical exam. You may also have tests, including:  X-rays.  A CT scan.  An MRI.  An electromyogram (EMG).  Nerve conduction tests. How is this treated? In many cases, treatment is not needed for this condition. With rest, the condition usually gets better over time. If treatment is needed, options may include:  Wearing a soft neck collar (cervical collar) for short periods of time, as told by your health care provider.  Doing physical therapy to strengthen your neck muscles.  Taking medicines, such as NSAIDs or oral corticosteroids.  Having spinal injections, in severe cases.  Having surgery. This may be needed if other treatments do not help. Different types of surgery may be done depending on the cause of  this condition. Follow these instructions at home: If you have a cervical collar:  Wear it as told by your health care provider. Remove it only as told by your health care provider.  Ask your health care provider if you can remove the collar for cleaning and bathing. If you are allowed to remove the collar for cleaning  or bathing: ? Follow instructions from your health care provider about how to remove the collar safely. ? Clean the collar by wiping it with mild soap and water and drying it completely. ? Take out any removable pads in the collar every 1-2 days, and wash them by hand with soap and water. Let them air-dry completely before you put them back in the collar. ? Check your skin under the collar for irritation or sores. If you see any, tell your health care provider. Managing pain      Take over-the-counter and prescription medicines only as told by your health care provider.  If directed, put ice on the affected area. ? If you have a soft neck collar, remove it as told by your health care provider. ? Put ice in a plastic bag. ? Place a towel between your skin and the bag. ? Leave the ice on for 20 minutes, 2-3 times a day.  If applying ice does not help, you can try using heat. Use the heat source that your health care provider recommends, such as a moist heat pack or a heating pad. ? Place a towel between your skin and the heat source. ? Leave the heat on for 20-30 minutes. ? Remove the heat if your skin turns bright red. This is especially important if you are unable to feel pain, heat, or cold. You may have a greater risk of getting burned.  Try a gentle neck and shoulder massage to help relieve symptoms. Activity  Rest as needed.  Return to your normal activities as told by your health care provider. Ask your health care provider what activities are safe for you.  Do stretching and strengthening exercises as told by your health care provider or physical therapist.  Do not lift anything that is heavier than 10 lb (4.5 kg) until your health care provider tells you that it is safe. General instructions  Use a flat pillow when you sleep.  Do not drive while wearing a cervical collar. If you do not have a cervical collar, ask your health care provider if it is safe to drive while your  neck heals.  Ask your health care provider if the medicine prescribed to you requires you to avoid driving or using heavy machinery.  Do not use any products that contain nicotine or tobacco, such as cigarettes, e-cigarettes, and chewing tobacco. These can delay healing. If you need help quitting, ask your health care provider.  Keep all follow-up visits as told by your health care provider. This is important. Contact a health care provider if:  Your condition does not improve with treatment. Get help right away if:  Your pain gets much worse and cannot be controlled with medicines.  You have weakness or numbness in your hand, arm, face, or leg.  You have a high fever.  You have a stiff, rigid neck.  You lose control of your bowels or your bladder (have incontinence).  You have trouble with walking, balance, or speaking. Summary  Cervical radiculopathy happens when a nerve in the neck is pinched or bruised.  A nerve can get pinched from a bulging disk,  arthritis, muscle spasms, or an injury to the neck.  Symptoms include pain, tingling, or numbness radiating from the neck into the arm or hand. Weakness can also occur in severe cases.  Treatment may include rest, wearing a cervical collar, and physical therapy. Medicines may be prescribed to help with pain. In severe cases, injections or surgery may be needed. This information is not intended to replace advice given to you by your health care provider. Make sure you discuss any questions you have with your health care provider. Document Revised: 01/11/2018 Document Reviewed: 01/11/2018 Elsevier Patient Education  2020 [employer] This Encounter  Procedures  . DG Cervical Spine Complete    Meds ordered this encounter  Medications  . naproxen (NAPROSYN) 375 MG tablet    Sig: Take 1 tablet (375 mg total) by mouth 3 (three) times daily with meals. Use daily for 5-7 days, then as needed for  aches/pains after that    Dispense:  90 tablet    Refill:  0  . predniSONE (DELTASONE) 20 MG tablet    Sig: Take 1 tablet (20 mg total) by mouth 2 (two) times daily with a meal.    Dispense:  10 tablet    Refill:  0       Follow-up instructions: Return for VISIT WITH SPORTS MEDICINE FOR ORTHOPEDIC ISSUE IF NO BETTER .                                         BP (!) 149/77   Pulse 85   Ht 5' 3.25" (1.607 m)   Wt 189 lb (85.7 kg)   LMP 01/14/2020   BMI 33.22 kg/m   No outpatient medications have been marked as taking for the 01/14/20 encounter (Office Visit) with Emeterio Reeve, DO.    No results found for this or any previous visit (from the past 72 hour(s)).  DG Cervical Spine Complete  Result Date: 01/14/2020 CLINICAL DATA:  44 year old female with a history of cervical radiculopathy on the right EXAM: CERVICAL SPINE - COMPLETE 4+ VIEW COMPARISON:  None. FINDINGS: Cervical Spine: Cervical elements maintain relative anatomic alignment from the level of C1-T1. Unremarkable appearance of the craniocervical junction. No subluxation, anterolisthesis, retrolisthesis. No acute fracture line identified. Vertebral body heights maintained. Disc space narrowing at C5-C6 with uncovertebral joint disease and anterior osteophyte production. Right oblique images demonstrates foraminal narrowing at C5-C6 secondary to facet spurring and uncovertebral joint disease. Left oblique images demonstrate no significant foraminal narrowing. Open mouth odontoid view unremarkable. Prevertebral soft tissues within normal limits. IMPRESSION: Negative for acute fracture or malalignment of the cervical spine. Degenerative changes are worst at C5-C6 as above. Electronically Signed   By: Corrie Mckusick D.O.   On: 01/14/2020 16:22       All questions at time of visit were answered - patient instructed to contact office with any additional concerns or updates.  ER/RTC  precautions were reviewed with the patient as applicable.   Please note: voice recognition software was used to produce this document, and typos may escape review. Please contact Dr. Sheppard Coil for any needed clarifications.

## 2020-02-02 ENCOUNTER — Encounter: Payer: BC Managed Care – PPO | Admitting: Gastroenterology

## 2020-11-15 ENCOUNTER — Telehealth: Payer: Self-pay | Admitting: General Practice

## 2020-11-15 NOTE — Telephone Encounter (Signed)
Transition Care Management Unsuccessful Follow-up Telephone Call  Date of discharge and from where:  11/13/20 from [facility]  Attempts:  1st Attempt  Reason for unsuccessful TCM follow-up call:  Left voice message

## 2020-11-17 NOTE — Telephone Encounter (Signed)
Transition Care Management Unsuccessful Follow-up Telephone Call  Date of discharge and from where:  11/13/20 from [facility]  Attempts:  2nd Attempt  Reason for unsuccessful TCM follow-up call:  Left voice message

## 2020-11-19 NOTE — Telephone Encounter (Signed)
Transition Care Management Unsuccessful Follow-up Telephone Call  Date of discharge and from where:  11/13/20 from [facility]  Attempts:  3rd Attempt  Reason for unsuccessful TCM follow-up call:  Left voice message

## 2020-12-29 IMAGING — MG DIGITAL SCREENING BILAT W/ TOMO W/ CAD
8 series · 8 of 24 positions shown · non-contrast
Comparison: Previous exam(s).

CLINICAL DATA: Screening.

EXAM:
DIGITAL SCREENING BILATERAL MAMMOGRAM WITH TOMO AND CAD

[R CC synth-2D]
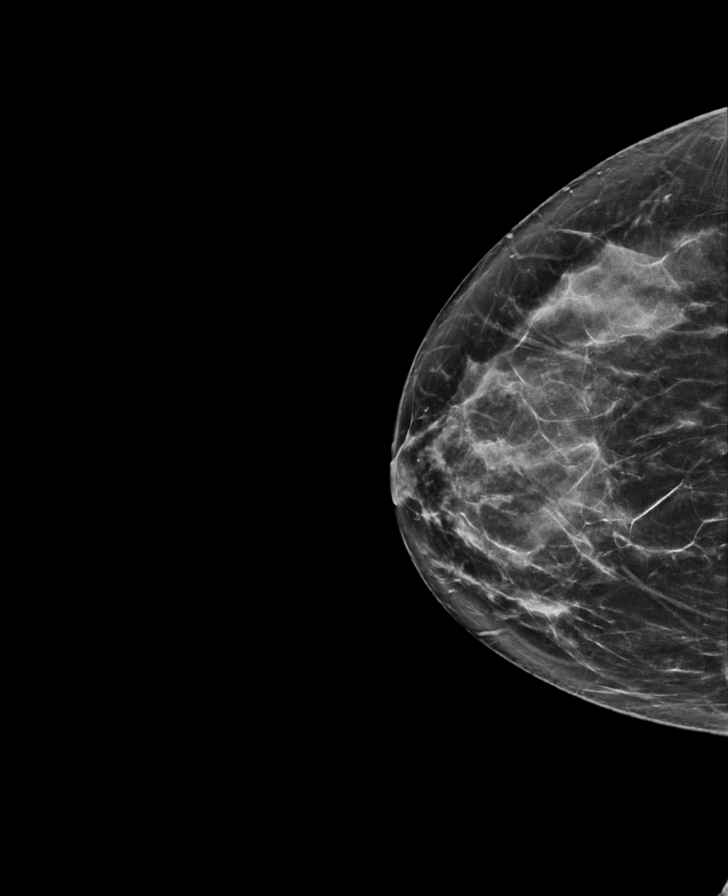

[L CC synth-2D]
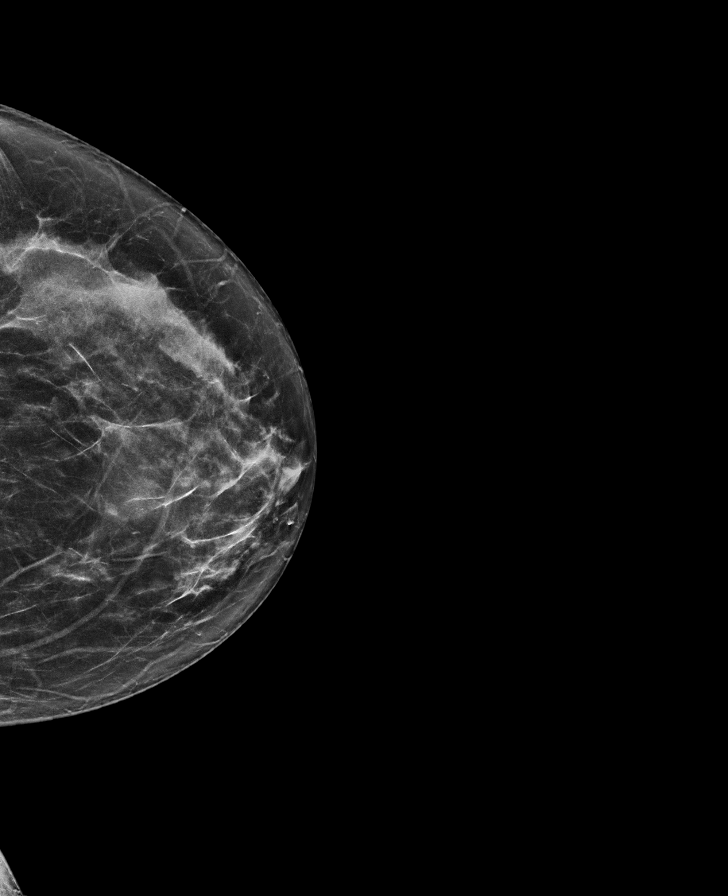

[R MLO synth-2D]
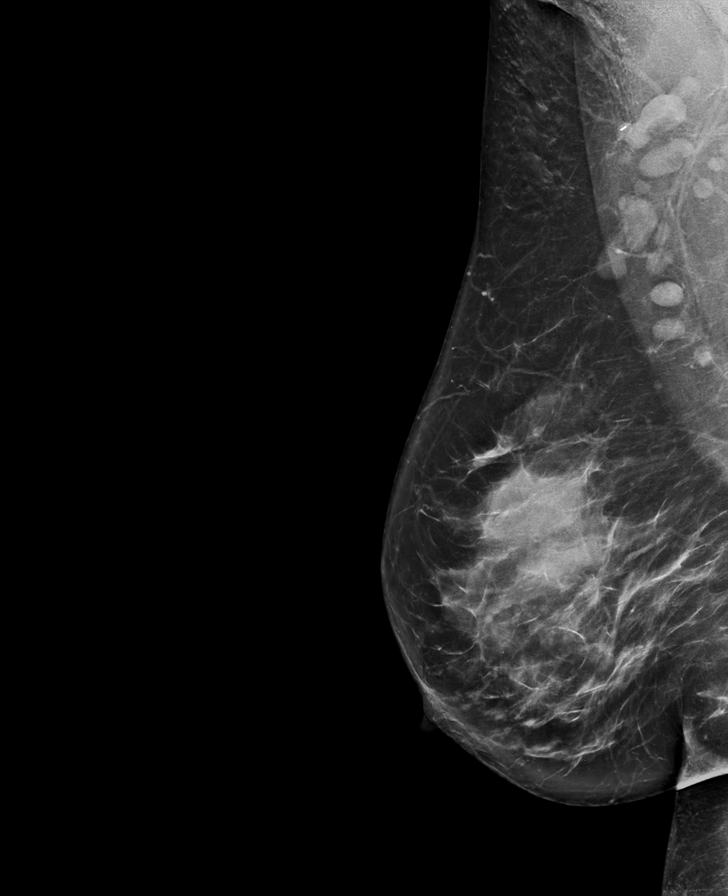

[L MLO synth-2D]
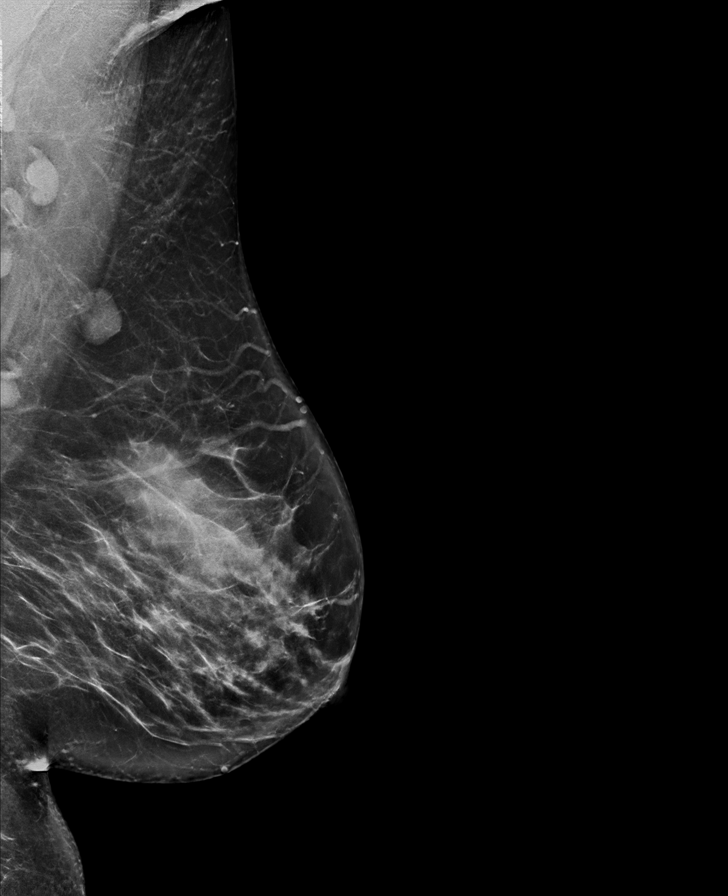

[L CC tomo · tomo slice 33/64.0]
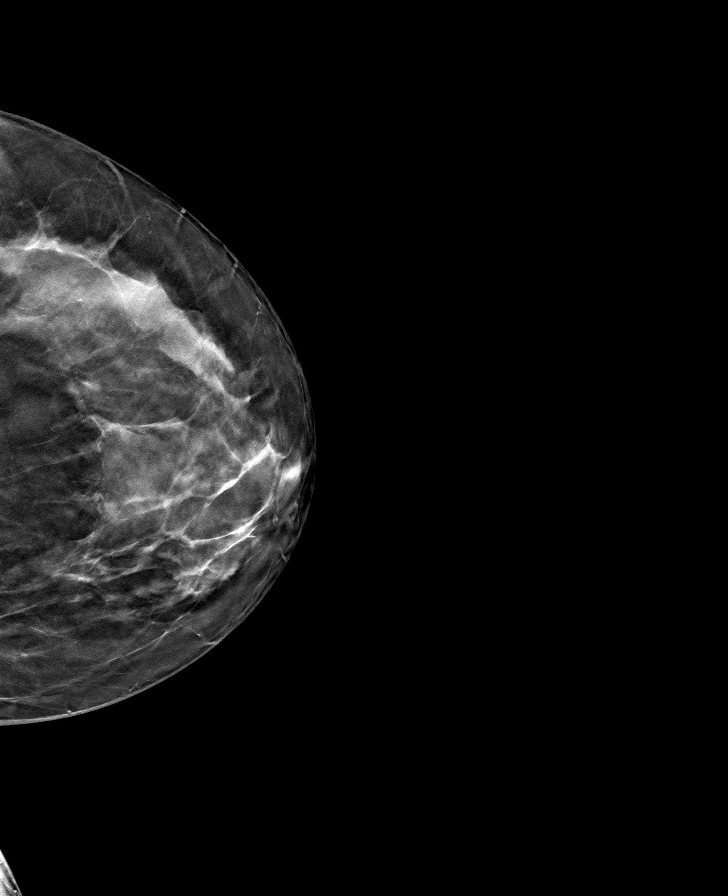

[R CC tomo · tomo slice 35/70.0]
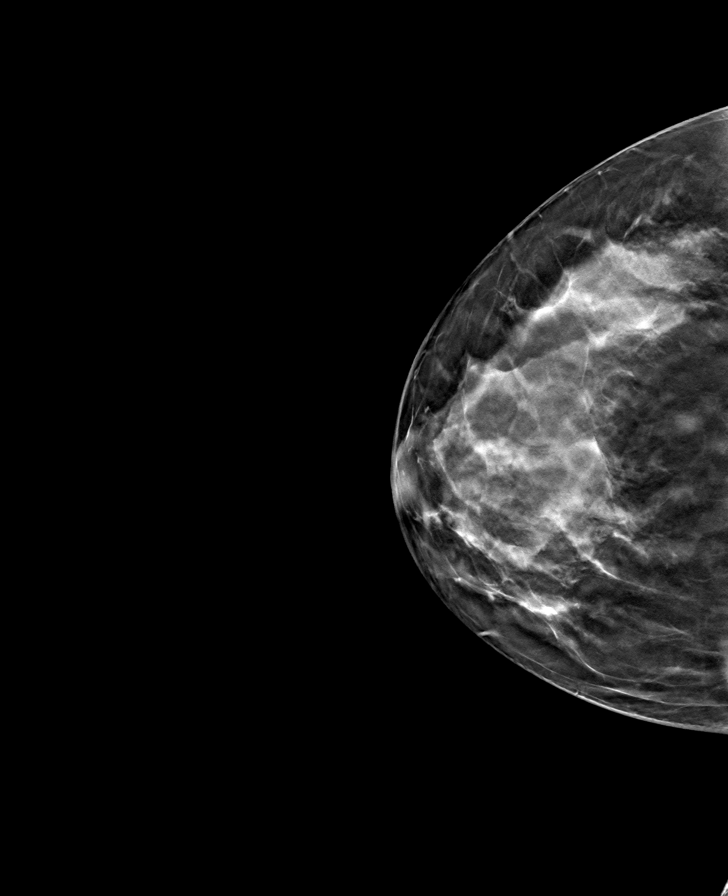

[R MLO tomo · tomo slice 45/88.0]
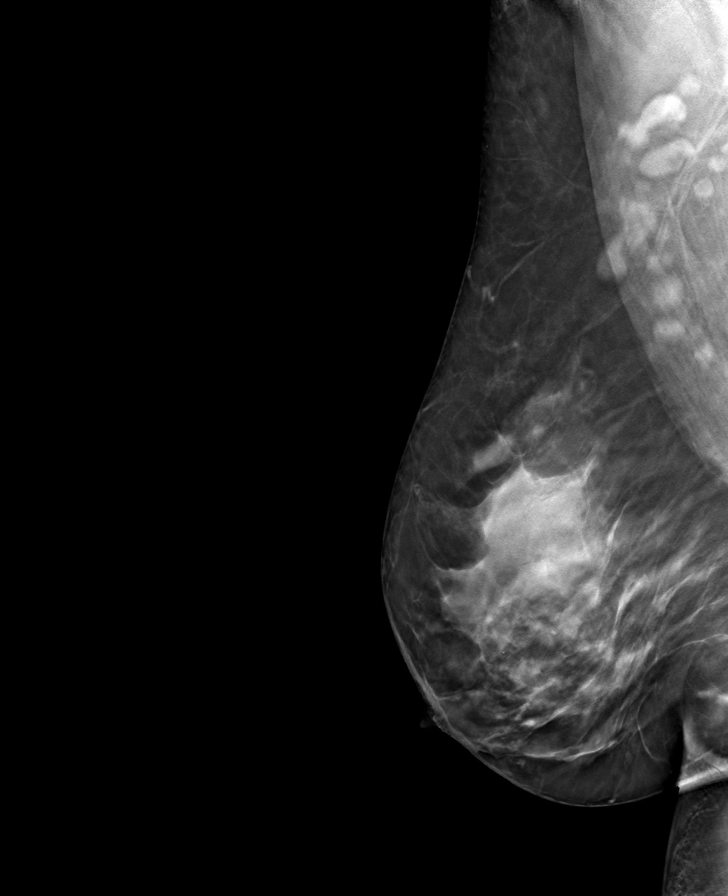

[L MLO tomo · tomo slice 41/82.0]
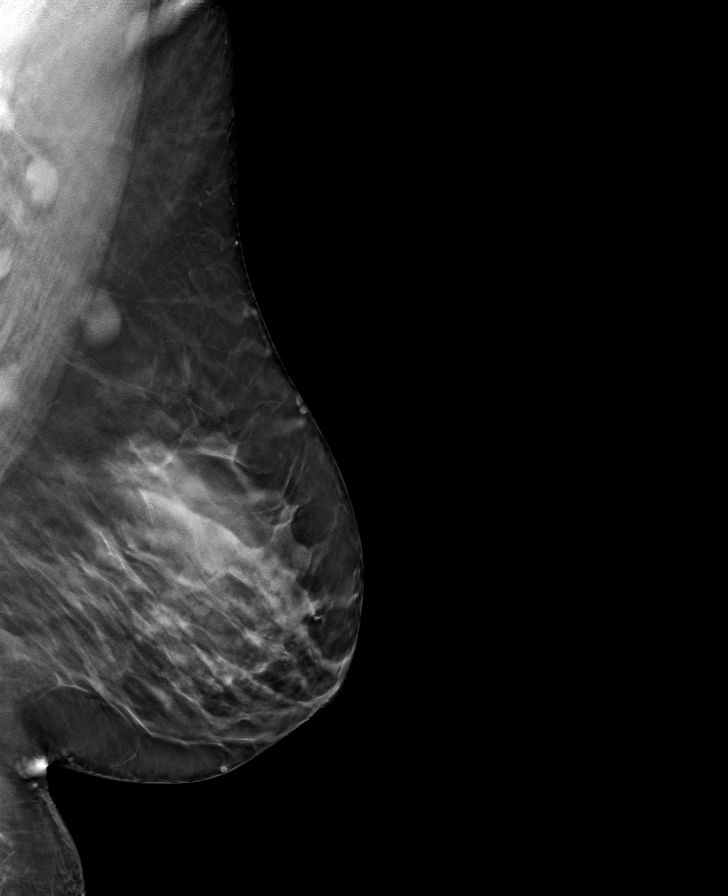

[8 of 24 positions shown; findings below may reference images not displayed]

ACR Breast Density Category c: The breast tissue is heterogeneously
dense, which may obscure small masses.
FINDINGS: There are no findings suspicious for malignancy. Images were
processed with CAD.
IMPRESSION: No mammographic evidence of malignancy. A result letter of this
screening mammogram will be mailed directly to the patient.

RECOMMENDATION:
Screening mammogram in one year. (Code:FT-U-LHB)

BI-RADS CATEGORY  1: Negative.

## 2021-01-25 ENCOUNTER — Other Ambulatory Visit: Payer: Self-pay

## 2021-01-25 ENCOUNTER — Encounter: Payer: Self-pay | Admitting: Physician Assistant

## 2021-01-25 ENCOUNTER — Ambulatory Visit (INDEPENDENT_AMBULATORY_CARE_PROVIDER_SITE_OTHER): Payer: Self-pay | Admitting: Physician Assistant

## 2021-01-25 VITALS — BP 112/62 | HR 79 | Temp 98.8°F | Ht 63.25 in | Wt 187.0 lb

## 2021-01-25 DIAGNOSIS — Z5320 Procedure and treatment not carried out because of patient's decision for unspecified reasons: Secondary | ICD-10-CM

## 2021-01-25 DIAGNOSIS — H538 Other visual disturbances: Secondary | ICD-10-CM

## 2021-01-25 DIAGNOSIS — Z1159 Encounter for screening for other viral diseases: Secondary | ICD-10-CM

## 2021-01-25 DIAGNOSIS — Z13220 Encounter for screening for lipoid disorders: Secondary | ICD-10-CM

## 2021-01-25 DIAGNOSIS — Z114 Encounter for screening for human immunodeficiency virus [HIV]: Secondary | ICD-10-CM

## 2021-01-25 DIAGNOSIS — H65191 Other acute nonsuppurative otitis media, right ear: Secondary | ICD-10-CM

## 2021-01-25 DIAGNOSIS — Z0100 Encounter for examination of eyes and vision without abnormal findings: Secondary | ICD-10-CM

## 2021-01-25 MED ORDER — FLUTICASONE PROPIONATE 50 MCG/ACT NA SUSP: 2.0000 | Freq: Every day | NASAL | 2 refills | Status: DC

## 2021-01-25 NOTE — Progress Notes (Signed)
Subjective:    Patient ID: Patricia Griffith, female    DOB: Apr 28, 1975, 45 y.o.   MRN: [id number]  HPI Pt is a 45 yo female who presents to the clinic with blurry vision for the last week and right ear itchiness.   She noticed her vision being more blurry while taking a class and could not see the teacher well. Both eyes are effected. She does wear glasses sometimes but has not been to the eye doctor since before 2019. No eye pain of vision loss. Concerned about diabetes.   She has some intermittent right ear fullness and itchiness. No fever, chills, sinus pressure. Congestion from time to time. No sT, cough, or SOB. Not tried anything to make better.   .. Active Ambulatory Problems    Diagnosis Date Noted   Heart murmur 01/18/2018   Blurry vision, bilateral 01/25/2021   HIV screening declined 01/25/2021   Acute effusion of right ear 01/25/2021   Resolved Ambulatory Problems    Diagnosis Date Noted   No Resolved Ambulatory Problems   Past Medical History:  Diagnosis Date   Hemorrhoids    HSV-2 (herpes simplex virus 2) infection    Vaginal Pap smear, abnormal     Review of Systems See HPI.     Objective:   Physical Exam Vitals reviewed.  Constitutional:      Appearance: Normal appearance. She is obese.  HENT:     Head: Normocephalic.     Right Ear: Ear canal and external ear normal. There is no impacted cerumen.     Left Ear: Tympanic membrane, ear canal and external ear normal. There is no impacted cerumen.     Ears:     Comments: Right TM with mild effusion    Nose: Congestion present.     Comments: Bilateral red and swollen turbinates.  Eyes:     Extraocular Movements: Extraocular movements intact.     Conjunctiva/sclera: Conjunctivae normal.     Pupils: Pupils are equal, round, and reactive to light.  Neck:     Vascular: No carotid bruit.  Cardiovascular:     Rate and Rhythm: Normal rate and regular rhythm.     Pulses: Normal pulses.     Heart sounds: Murmur  heard.  Pulmonary:     Effort: Pulmonary effort is normal.     Breath sounds: Normal breath sounds.  Abdominal:     General: Bowel sounds are normal. There is no distension.     Palpations: Abdomen is soft. There is no mass.     Tenderness: There is no abdominal tenderness. There is no right CVA tenderness, left CVA tenderness, guarding or rebound.  Musculoskeletal:     Cervical back: Neck supple.  Lymphadenopathy:     Cervical: No cervical adenopathy.  Neurological:     General: No focal deficit present.     Mental Status: She is alert and oriented to person, place, and time.  Psychiatric:        Mood and Affect: Mood normal.      .. Vision Screening   Right eye Left eye Both eyes  Without correction 20/40 20/30 20/25   With correction           Assessment & Plan:  Marland KitchenMarland KitchenRaileigh was seen today for blurred vision.  Diagnoses and all orders for this visit:  Blurry vision, bilateral -     Hemoglobin A1c -     COMPLETE METABOLIC PANEL WITH GFR -     CBC with Differential/Platelet -  TSH  Encounter for vision screening  Encounter for hepatitis C screening test for low risk patient -     Hepatitis C Antibody  HIV screening declined  Encounter for screening for HIV -     HIV antibody (with reflex)  Screening for lipid disorders -     Lipid Panel w/reflex Direct LDL  Acute effusion of right ear -     fluticasone (FLONASE) 50 MCG/ACT nasal spray; Place 2 sprays into both nostrils daily.  No red flags with vision.  Discussed borderline vision in individual eyes but together her eyes are within normal range.  Likely just needs prescription change.  See eye doctor.  Will check labs for thyroid and diabetes.  Need for screening labs so ordered as well.   Does have a mild effusion in right ear.  Start flonase daily and then as needed.  Ok to try claritin D as well.

## 2021-04-11 ENCOUNTER — Other Ambulatory Visit: Payer: Self-pay

## 2021-04-11 ENCOUNTER — Emergency Department
Admission: EM | Admit: 2021-04-11 | Discharge: 2021-04-11 | Disposition: A | Discharge status: Home / Self Care | Payer: Self-pay | Source: Home / Self Care

## 2021-04-11 DIAGNOSIS — R1030 Lower abdominal pain, unspecified: Secondary | ICD-10-CM

## 2021-04-11 DIAGNOSIS — N3000 Acute cystitis without hematuria: Secondary | ICD-10-CM

## 2021-04-11 LAB — POCT URINALYSIS DIP (MANUAL ENTRY)
Bilirubin, UA: NEGATIVE
Blood, UA: NEGATIVE
Glucose, UA: NEGATIVE mg/dL
Ketones, POC UA: NEGATIVE mg/dL
Nitrite, UA: NEGATIVE
Protein Ur, POC: NEGATIVE mg/dL
Spec Grav, UA: 1.025 (ref 1.010–1.025)
Urobilinogen, UA: 0.2 E.U./dL
pH, UA: 5.5 (ref 5.0–8.0)

## 2021-04-11 MED ORDER — CEPHALEXIN 500 MG PO CAPS: 500.0000 mg | ORAL_CAPSULE | Freq: Three times a day (TID) | ORAL | 0 refills | Status: AC

## 2021-04-11 NOTE — Discharge Instructions (Addendum)
Advised patient to take medication as directed with food to completion.  Encouraged patient to increase daily water intake while taking this medication.  Advised patient we will follow-up with urine culture results once received.  Advised/encouraged patient to follow-up with PCP/GYN for mammogram for better evaluation of breast pain and history of breast biopsies.

## 2021-04-11 NOTE — ED Triage Notes (Signed)
Pt states that she has some urinary frequency, urine has an odor and some abdominal pain. X2-3 days   Pt states that she also has some bilateral breast pain. X2 months  Pt states that she is in between insurance at the time and would like to address both issues.

## 2021-04-11 NOTE — ED Provider Notes (Signed)
Patricia Griffith CARE    CSN: [id number] Arrival date & time: 04/11/21  1309      History   Chief Complaint Chief Complaint  Patient presents with   Abdominal Pain    Abdominal pain, pressure, urine frequency and odor x2-3 days   Breast Pain    Bilateral breast pain x2 months    HPI Patricia Griffith is a 46 y.o. female.   HPI 46 year old female presents with abdominal pain/pressure and urinary frequency including foul odor for 2 to 3 days.  Additionally, patient reports bilateral breast pain x 2 months.  Reports last mammogram October of 2022.  PMH significant for previous breast biopsies.  Past Medical History:  Diagnosis Date   Heart murmur    Hemorrhoids    HSV-2 (herpes simplex virus 2) infection    Vaginal Pap smear, abnormal     Patient Active Problem List   Diagnosis Date Noted   Blurry vision, bilateral 01/25/2021   HIV screening declined 01/25/2021   Acute effusion of right ear 01/25/2021   Heart murmur 01/18/2018    Past Surgical History:  Procedure Laterality Date   BREAST BIOPSY     CARDIAC SURGERY     as a child   CESAREAN SECTION     x1   COLPOSCOPY     TUBAL LIGATION      OB History     Gravida  5   Para  3   Term  3   Preterm      AB  2   Living  3      SAB      IAB  2   Ectopic      Multiple      Live Births               Home Medications    Prior to Admission medications   Medication Sig Start Date End Date Taking? Authorizing Provider  cephALEXin (KEFLEX) 500 MG capsule Take 1 capsule (500 mg total) by mouth 3 (three) times daily for 7 days. 04/11/21 04/18/21 Yes Eliezer Lofts, FNP  AMBULATORY NON FORMULARY MEDICATION Medication Name: 0.125% Nitroglycerin apply sea sized amount per rectum three times daily for 8 weeks 11/12/19   Mauri Pole, MD  fluticasone (FLONASE) 50 MCG/ACT nasal spray Place 2 sprays into both nostrils daily. 01/25/21   Donella Stade, PA-C  hydrocortisone (ANUSOL-HC) 25 MG  suppository Place 1 suppository (25 mg total) rectally daily as needed for hemorrhoids or anal itching. 06/18/19   Mauri Pole, MD  naproxen (NAPROSYN) 375 MG tablet Take 1 tablet (375 mg total) by mouth 3 (three) times daily with meals. Use daily for 5-7 days, then as needed for aches/pains after that 01/14/20   Emeterio Reeve, DO    Family History Family History  Problem Relation Age of Onset   Heart failure Mother    Diabetes Mother    Hyperlipidemia Mother    Congestive Heart Failure Father    Breast cancer Maternal Grandmother    Diabetes Maternal Grandmother    Liver cancer Paternal Grandmother    Prostate cancer Paternal Grandfather    Colon cancer Maternal Grandfather     Social History Social History   Tobacco Use   Smoking status: Every Day    Packs/day: 0.50    Years: 18.00    Pack years: 9.00    Types: Cigarettes   Smokeless tobacco: Never  Vaping Use   Vaping Use: Never used  Substance Use  Topics   Alcohol use: Yes    Comment: socially   Drug use: Never     Allergies   Sulfa antibiotics and Witch hazel   Review of Systems Review of Systems  Gastrointestinal:  Positive for abdominal pain.  Genitourinary:  Positive for frequency.  All other systems reviewed and are negative.   Physical Exam Triage Vital Signs ED Triage Vitals  Enc Vitals Group     BP 04/11/21 1334 112/66     Pulse Rate 04/11/21 1334 78     Resp 04/11/21 1334 18     Temp 04/11/21 1334 98.9 F (37.2 C)     Temp Source 04/11/21 1334 Oral     SpO2 04/11/21 1334 99 %     Weight 04/11/21 1331 185 lb (83.9 kg)     Height 04/11/21 1331 5\' 4"  (1.626 m)     Head Circumference --      Peak Flow --      Pain Score 04/11/21 1331 6     Pain Loc --      Pain Edu? --      Excl. in [place]? --    No data found.  Updated Vital Signs BP 112/66 (BP Location: Left Arm)    Pulse 78    Temp 98.9 F (37.2 C) (Oral)    Resp 18    Ht 5\' 4"  (1.626 m)    Wt 185 lb (83.9 kg)    LMP  03/26/2021    SpO2 99%    BMI 31.76 kg/m      Physical Exam Vitals and nursing note reviewed.  Constitutional:      General: She is not in acute distress.    Appearance: Normal appearance. She is normal weight. She is not ill-appearing.  HENT:     Head: Normocephalic and atraumatic.     Mouth/Throat:     Mouth: Mucous membranes are moist.     Pharynx: Oropharynx is clear.  Eyes:     Extraocular Movements: Extraocular movements intact.     Conjunctiva/sclera: Conjunctivae normal.     Pupils: Pupils are equal, round, and reactive to light.  Cardiovascular:     Rate and Rhythm: Normal rate and regular rhythm.     Pulses: Normal pulses.     Heart sounds: Normal heart sounds.  Pulmonary:     Effort: Pulmonary effort is normal.     Breath sounds: Normal breath sounds.  Musculoskeletal:     Cervical back: Normal range of motion and neck supple.  Skin:    General: Skin is warm and dry.  Neurological:     General: No focal deficit present.     Mental Status: She is alert and oriented to person, place, and time.     UC Treatments / Results  Labs (all labs ordered are listed, but only abnormal results are displayed) Labs Reviewed  POCT URINALYSIS DIP (MANUAL ENTRY) - Abnormal; Notable for the following components:      Result Value   Leukocytes, UA Large (3+) (*)    All other components within normal limits  URINE CULTURE    EKG   Radiology No results found.  Procedures Procedures (including critical care time)  Medications Ordered in UC Medications - No data to display  Initial Impression / Assessment and Plan / UC Course  I have reviewed the triage vital signs and the nursing notes.  Pertinent labs & imaging results that were available during my care of the patient were reviewed by  me and considered in my medical decision making (see chart for details).     MDM: 1.  Urinary frequency-UA reveals large leukocytes, culture ordered; Rx'd Keflex. Advised patient to  take medication as directed with food to completion.  Encouraged patient to increase daily water intake while taking this medication.  Advised patient we will follow-up with urine culture results once received.  Advised/encouraged patient to follow-up with PCP/GYN for mammogram for better evaluation of breast pain and history of breast biopsies.  Patient discharged home, hemodynamically stable. Final Clinical Impressions(s) / UC Diagnoses   Final diagnoses:  Lower abdominal pain  Acute cystitis without hematuria     Discharge Instructions      Advised patient to take medication as directed with food to completion.  Encouraged patient to increase daily water intake while taking this medication.  Advised patient we will follow-up with urine culture results once received.  Advised/encouraged patient to follow-up with PCP/GYN for mammogram for better evaluation of breast pain and history of breast biopsies.      ED Prescriptions     Medication Sig Dispense Auth. Provider   cephALEXin (KEFLEX) 500 MG capsule Take 1 capsule (500 mg total) by mouth 3 (three) times daily for 7 days. 21 capsule Eliezer Lofts, FNP      PDMP not reviewed this encounter.   Eliezer Lofts, [place] 04/11/21 1416

## 2021-04-13 LAB — URINE CULTURE
MICRO NUMBER:: 12970039
SPECIMEN QUALITY:: ADEQUATE

## 2023-01-11 ENCOUNTER — Ambulatory Visit (INDEPENDENT_AMBULATORY_CARE_PROVIDER_SITE_OTHER): Payer: 59 | Admitting: Family Medicine

## 2023-01-11 ENCOUNTER — Encounter: Payer: Self-pay | Admitting: Family Medicine

## 2023-01-11 VITALS — BP 124/74 | HR 82 | Ht 64.0 in | Wt 186.8 lb

## 2023-01-11 DIAGNOSIS — Z1211 Encounter for screening for malignant neoplasm of colon: Secondary | ICD-10-CM | POA: Diagnosis not present

## 2023-01-11 DIAGNOSIS — N898 Other specified noninflammatory disorders of vagina: Secondary | ICD-10-CM | POA: Diagnosis not present

## 2023-01-11 DIAGNOSIS — Z1159 Encounter for screening for other viral diseases: Secondary | ICD-10-CM

## 2023-01-11 DIAGNOSIS — Z1231 Encounter for screening mammogram for malignant neoplasm of breast: Secondary | ICD-10-CM

## 2023-01-11 DIAGNOSIS — Z0000 Encounter for general adult medical examination without abnormal findings: Secondary | ICD-10-CM | POA: Insufficient documentation

## 2023-01-11 DIAGNOSIS — Z114 Encounter for screening for human immunodeficiency virus [HIV]: Secondary | ICD-10-CM

## 2023-01-11 NOTE — Assessment & Plan Note (Signed)
Pt needs mammogram and updated blood work. Does want to get hiv and hep c screening - pt said she had colonoscopy already and needed an updated colonoscopy because she was told to go back in 3 years  - have gone ahead and placed referral

## 2023-01-11 NOTE — Progress Notes (Signed)
Established patient visit   Patient: Patricia Griffith   DOB: February 14, 1976   47 y.o. Female  MRN: [id number] Visit Date: 01/11/2023  Today's healthcare provider: Charlton Amor, DO   Chief Complaint  Patient presents with   Annual Exam    SUBJECTIVE    Chief Complaint  Patient presents with   Annual Exam   HPI  Pt presents to transfer care. She was a previous patient of Dr. Lyn Hollingshead who has left the clinic. Pt today requesting wellness viist.   She works at [employer] and does a lot of walking on her shift. Says her diet could improve.   Family hx of cancers: PGM-liver cancer PGP-prostate cancer   Review of Systems  Constitutional:  Negative for activity change, fatigue and fever.  Respiratory:  Negative for cough and shortness of breath.   Cardiovascular:  Negative for chest pain.  Gastrointestinal:  Negative for abdominal pain.  Genitourinary:  Negative for difficulty urinating.       No outpatient medications have been marked as taking for the 01/11/23 encounter (Office Visit) with Charlton Amor, DO.    OBJECTIVE    BP 124/74 (BP Location: Left Arm, Patient Position: Sitting, Cuff Size: Large)   Pulse 82   Ht 5\' 4"  (1.626 m)   Wt 186 lb 12 oz (84.7 kg)   SpO2 99%   BMI 32.06 kg/m   Physical Exam Vitals and nursing note reviewed.  Constitutional:      General: She is not in acute distress.    Appearance: Normal appearance.  HENT:     Head: Normocephalic and atraumatic.     Right Ear: Tympanic membrane, ear canal and external ear normal.     Left Ear: Tympanic membrane, ear canal and external ear normal.     Nose: Nose normal.  Eyes:     Conjunctiva/sclera: Conjunctivae normal.  Cardiovascular:     Rate and Rhythm: Normal rate and regular rhythm.  Pulmonary:     Effort: Pulmonary effort is normal.     Breath sounds: Normal breath sounds.  Abdominal:     General: Abdomen is flat. Bowel sounds are normal.     Palpations: Abdomen is soft.  Neurological:      General: No focal deficit present.     Mental Status: She is alert and oriented to person, place, and time.  Psychiatric:        Mood and Affect: Mood normal.        Behavior: Behavior normal.        Thought Content: Thought content normal.        Judgment: Judgment normal.        ASSESSMENT & PLAN    Problem List Items Addressed This Visit       Other   Routine adult health maintenance - Primary    Pt needs mammogram and updated blood work. Does want to get hiv and hep c screening - pt said she had colonoscopy already and needed an updated colonoscopy because she was told to go back in 3 years  - have gone ahead and placed referral      Relevant Orders   CBC   CMP14+EGFR   Lipid panel   Vaginal discharge    Pt says she gets a foul, odorous vaginal discharge prior to her menstrual cycle and after her cycle. She says it usually will resolve after a few days. We had a discussion that likely it can be normal discharge that  happens when you are ovulating. Other option is if she is concerned about it to do a vaginal swab to test for bv. Pt says she does not have the discharge now. Encouraged her to let us know when it occurs so we can get her in for an appointment to do a vaginal swab and rule out BV. Also encouraged pt to make an apt with her obgyn for yearly checks as well       Other Visit Diagnoses     Screening for HIV (human immunodeficiency virus)       Relevant Orders   HIV antibody (with reflex)   Encounter for hepatitis C screening test for low risk patient       Relevant Orders   Hepatitis C antibody   Encounter for screening mammogram for malignant neoplasm of breast       Relevant Orders   MM DIGITAL SCREENING BILATERAL   Screening for colon cancer       Relevant Orders   Ambulatory referral to Gastroenterology       Return in about 1 year (around 01/11/2024) for physical.      No orders of the defined types were placed in this encounter.   Orders  Placed This Encounter  Procedures   MM DIGITAL SCREENING BILATERAL    Standing Status:   Future    Standing Expiration Date:   01/11/2024    Order Specific Question:   Is the patient pregnant?    Answer:   No    Order Specific Question:   Preferred imaging location?    Answer:   [facility]    Order Specific Question:   Reason for exam:    Answer:   screening for breast cancer    Order Specific Question:   Release to patient    Answer:   Immediate   CBC   CMP14+EGFR    Order Specific Question:   Has the patient fasted?    Answer:   No   Lipid panel    Order Specific Question:   Has the patient fasted?    Answer:   No    Order Specific Question:   Release to patient    Answer:   Immediate   HIV antibody (with reflex)   Hepatitis C antibody   Ambulatory referral to Gastroenterology    Referral Priority:   Routine    Referral Type:   Consultation    Referral Reason:   Specialty Services Required    Number of Visits Requested:   1     Charlton Amor, DO  [facility] at [facility] [phone] (phone) [phone] (fax)  [facility]

## 2023-01-11 NOTE — Assessment & Plan Note (Signed)
Pt says she gets a foul, odorous vaginal discharge prior to her menstrual cycle and after her cycle. She says it usually will resolve after a few days. We had a discussion that likely it can be normal discharge that happens when you are ovulating. Other option is if she is concerned about it to do a vaginal swab to test for bv. Pt says she does not have the discharge now. Encouraged her to let us know when it occurs so we can get her in for an appointment to do a vaginal swab and rule out BV. Also encouraged pt to make an apt with her obgyn for yearly checks as well

## 2023-01-12 LAB — CMP14+EGFR
ALT: 16 [IU]/L (ref 0–32)
AST: 15 [IU]/L (ref 0–40)
Albumin: 4.2 g/dL (ref 3.9–4.9)
Alkaline Phosphatase: 75 [IU]/L (ref 44–121)
BUN/Creatinine Ratio: 16 (ref 9–23)
BUN: 10 mg/dL (ref 6–24)
Bilirubin Total: 0.2 mg/dL (ref 0.0–1.2)
CO2: 22 mmol/L (ref 20–29)
Calcium: 9.2 mg/dL (ref 8.7–10.2)
Chloride: 106 mmol/L (ref 96–106)
Creatinine, Ser: 0.64 mg/dL (ref 0.57–1.00)
Globulin, Total: 3.6 g/dL (ref 1.5–4.5)
Glucose: 78 mg/dL (ref 70–99)
Potassium: 4.5 mmol/L (ref 3.5–5.2)
Sodium: 141 mmol/L (ref 134–144)
Total Protein: 7.8 g/dL (ref 6.0–8.5)
eGFR: 111 mL/min/{1.73_m2} (ref >59)

## 2023-01-12 LAB — CBC
Hematocrit: 37.7 % (ref 34.0–46.6)
Hemoglobin: 11.7 g/dL (ref 11.1–15.9)
MCH: 25.1 pg — ABNORMAL LOW (ref 26.6–33.0)
MCHC: 31 g/dL — ABNORMAL LOW (ref 31.5–35.7)
MCV: 81 fL (ref 79–97)
Platelets: 392 10*3/uL (ref 150–450)
RBC: 4.67 x10E6/uL (ref 3.77–5.28)
RDW: 17.8 % — ABNORMAL HIGH (ref 11.7–15.4)
WBC: 8.3 10*3/uL (ref 3.4–10.8)

## 2023-01-12 LAB — HEPATITIS C ANTIBODY: Hep C Virus Ab: NONREACTIVE

## 2023-01-12 LAB — LIPID PANEL
Chol/HDL Ratio: 4.5 ratio — ABNORMAL HIGH (ref 0.0–4.4)
Cholesterol, Total: 181 mg/dL (ref 100–199)
HDL: 40 mg/dL (ref >39)
LDL Chol Calc (NIH): 130 mg/dL — ABNORMAL HIGH (ref 0–99)
Triglycerides: 59 mg/dL (ref 0–149)
VLDL Cholesterol Cal: 11 mg/dL (ref 5–40)

## 2023-01-12 LAB — HIV ANTIBODY (ROUTINE TESTING W REFLEX): HIV Screen 4th Generation wRfx: NONREACTIVE

## 2023-02-14 ENCOUNTER — Ambulatory Visit: Payer: 59

## 2023-02-14 ENCOUNTER — Other Ambulatory Visit: Payer: Self-pay | Admitting: Family Medicine

## 2023-02-14 ENCOUNTER — Inpatient Hospital Stay: Admission: RE | Admit: 2023-02-14 | Payer: 59 | Source: Ambulatory Visit

## 2023-02-14 DIAGNOSIS — Z1159 Encounter for screening for other viral diseases: Secondary | ICD-10-CM

## 2023-02-14 DIAGNOSIS — Z0000 Encounter for general adult medical examination without abnormal findings: Secondary | ICD-10-CM

## 2023-02-14 DIAGNOSIS — Z1211 Encounter for screening for malignant neoplasm of colon: Secondary | ICD-10-CM

## 2023-02-14 DIAGNOSIS — Z1231 Encounter for screening mammogram for malignant neoplasm of breast: Secondary | ICD-10-CM | POA: Diagnosis not present

## 2023-02-14 DIAGNOSIS — Z114 Encounter for screening for human immunodeficiency virus [HIV]: Secondary | ICD-10-CM

## 2023-02-14 DIAGNOSIS — N898 Other specified noninflammatory disorders of vagina: Secondary | ICD-10-CM

## 2023-06-01 DIAGNOSIS — Z1331 Encounter for screening for depression: Secondary | ICD-10-CM | POA: Diagnosis not present

## 2023-06-01 DIAGNOSIS — K644 Residual hemorrhoidal skin tags: Secondary | ICD-10-CM | POA: Diagnosis not present

## 2023-06-01 DIAGNOSIS — K648 Other hemorrhoids: Secondary | ICD-10-CM | POA: Diagnosis not present

## 2023-06-18 ENCOUNTER — Ambulatory Visit: Payer: 59 | Admitting: Gastroenterology

## 2023-07-12 ENCOUNTER — Encounter: Payer: Self-pay | Admitting: Family Medicine

## 2023-10-12 ENCOUNTER — Telehealth: Payer: Self-pay

## 2023-10-12 NOTE — Telephone Encounter (Signed)
 Patient scheduled for new patient appt with Benton Gave 12/17/23

## 2023-10-12 NOTE — Telephone Encounter (Signed)
 Copied from CRM [id number]. Topic: Appointments - Scheduling Inquiry for Clinic >> Oct 12, 2023  4:26 PM Miquel SAILOR wrote: Reason for CRM: Patient tried to get PT done by 01/12/24 but earliest is for 01/14/24.She did not want to getit after all.No further questions

## 2023-12-17 ENCOUNTER — Ambulatory Visit: Admitting: Urgent Care

## 2024-01-01 DIAGNOSIS — K219 Gastro-esophageal reflux disease without esophagitis: Secondary | ICD-10-CM | POA: Diagnosis not present

## 2024-01-01 DIAGNOSIS — F17210 Nicotine dependence, cigarettes, uncomplicated: Secondary | ICD-10-CM | POA: Diagnosis not present

## 2024-01-01 DIAGNOSIS — Z882 Allergy status to sulfonamides status: Secondary | ICD-10-CM | POA: Diagnosis not present

## 2024-01-01 DIAGNOSIS — Z888 Allergy status to other drugs, medicaments and biological substances status: Secondary | ICD-10-CM | POA: Diagnosis not present

## 2024-01-01 DIAGNOSIS — K642 Third degree hemorrhoids: Secondary | ICD-10-CM | POA: Diagnosis not present

## 2024-01-01 DIAGNOSIS — K644 Residual hemorrhoidal skin tags: Secondary | ICD-10-CM | POA: Diagnosis not present

## 2024-01-01 DIAGNOSIS — E669 Obesity, unspecified: Secondary | ICD-10-CM | POA: Diagnosis not present

## 2024-01-01 DIAGNOSIS — K648 Other hemorrhoids: Secondary | ICD-10-CM | POA: Diagnosis not present

## 2024-01-01 DIAGNOSIS — Z6834 Body mass index (BMI) 34.0-34.9, adult: Secondary | ICD-10-CM | POA: Diagnosis not present

## 2024-01-01 DIAGNOSIS — Z800 Family history of malignant neoplasm of digestive organs: Secondary | ICD-10-CM | POA: Diagnosis not present

## 2024-01-25 ENCOUNTER — Telehealth: Payer: Self-pay

## 2024-01-25 NOTE — Telephone Encounter (Signed)
 LM to return call to schedule TOC

## 2024-01-25 NOTE — Telephone Encounter (Signed)
 Copied from CRM [id number]. Topic: General - Other >> Jan 24, 2024  3:07 PM Charlet HERO wrote: Reason for CRM: Patient would like to have  a non smoking cessation please contact the patient.

## 2024-01-25 NOTE — Telephone Encounter (Signed)
 Pt needs to establish care with me first please. Feel free to offer her an appointment.

## 2024-04-07 ENCOUNTER — Encounter: Admitting: Obstetrics & Gynecology

## 2024-04-09 ENCOUNTER — Other Ambulatory Visit (HOSPITAL_COMMUNITY): Payer: Self-pay

## 2024-04-09 ENCOUNTER — Other Ambulatory Visit: Payer: Self-pay

## 2024-04-09 ENCOUNTER — Ambulatory Visit: Admitting: Urgent Care

## 2024-04-09 VITALS — BP 110/70 | HR 77 | Ht 64.0 in | Wt 190.0 lb

## 2024-04-09 DIAGNOSIS — F17210 Nicotine dependence, cigarettes, uncomplicated: Secondary | ICD-10-CM | POA: Diagnosis not present

## 2024-04-09 DIAGNOSIS — M5431 Sciatica, right side: Secondary | ICD-10-CM

## 2024-04-09 DIAGNOSIS — Z0000 Encounter for general adult medical examination without abnormal findings: Secondary | ICD-10-CM

## 2024-04-09 MED ORDER — BUPROPION HCL ER (SMOKING DET) 150 MG PO TB12
ORAL_TABLET | ORAL | 1 refills | Status: AC
  Filled 2024-04-09: qty 180, 90d supply, fill #0

## 2024-04-09 NOTE — Patient Instructions (Addendum)
 Please complete your pap smear with gynecology.  Labs updated today.  For your sciatic pain, please do the exercises attached to this form. If symptoms persist, we would consider prednisone  and muscle relaxers.  Start bupropion . Take once daily x 3 days, then increase to twice daily. Please take for up to three months after your last cigarette.

## 2024-04-09 NOTE — Progress Notes (Unsigned)
 "  Annual Wellness Visit     Patient: Patricia Griffith, Female    DOB: 01/21/1976, 49 y.o.   MRN: [id number]  Subjective  Chief Complaint  Patient presents with   Annual Exam    fasting    Patricia Griffith is a 49 y.o. female who presents today for her Annual Wellness Visit. She reports consuming a general diet. Pt works at [employer]. She walks daily, gets in about 2,000 steps daily. She generally feels well. She reports sleeping well. She does have additional problems to discuss today.    Had hemorrhoidectomy in October 2025 and has been constipated since.  Has hx of sciatica. Currently having a flare, start on R lower lumbar spine and extends down the front of thigh anteriorly.   HPI  Vision: no routine eye exams - had one last year Dental: has seen them twice a year  Pt would like to quit smoking. Has been smoking since 2000. Currently smoking 1/2 pack a day (down from a pack a day previously).   Patient Active Problem List   Diagnosis Date Noted   Routine adult health maintenance 01/11/2023   Vaginal discharge 01/11/2023   Blurry vision, bilateral 01/25/2021   HIV screening declined 01/25/2021   Acute effusion of right ear 01/25/2021   Heart murmur 01/18/2018   Past Medical History:  Diagnosis Date   Heart murmur    Hemorrhoids    HSV-2 (herpes simplex virus 2) infection    Vaginal Pap smear, abnormal    Past Surgical History:  Procedure Laterality Date   BREAST BIOPSY     CARDIAC SURGERY     as a child   CESAREAN SECTION     x1   COLPOSCOPY     TUBAL LIGATION     Social History[1]    Medications: Show/hide medication list[2]  Allergies[3]  Patient Care Team: Lowella Benton CROME, [place] as PCP - General (Physician Assistant)  ROS      Objective  BP 110/70   Pulse 77   Ht 5' 4 (1.626 m)   Wt 190 lb (86.2 kg)   SpO2 100%   BMI 32.61 kg/m  BP Readings from Last 3 Encounters:  04/09/24 110/70  01/11/23 124/74  04/11/21 112/66   Wt Readings  from Last 3 Encounters:  04/09/24 190 lb (86.2 kg)  01/11/23 186 lb 12 oz (84.7 kg)  04/11/21 185 lb (83.9 kg)      Physical Exam  Most recent depression screenings:    04/09/2024   11:28 AM 01/11/2023   10:54 AM  PHQ 2/9 Scores  PHQ - 2 Score 0 0  PHQ- 9 Score 0 0      Data saved with a previous flowsheet row definition    Fall Screening Falls in the past year?: 0 Number of falls in past year: 0 Was there an injury with Fall?: 0 Fall Risk Category Calculator: 0 Patient Fall Risk Level: Low Fall Risk  Fall Risk Patient at Risk for Falls Due to: No Fall Risks Fall risk Follow up: Falls evaluation completed    Vision/Hearing Screen: No results found.  Last CBC Lab Results  Component Value Date   WBC 8.3 01/11/2023   HGB 11.7 01/11/2023   HCT 37.7 01/11/2023   MCV 81 01/11/2023   MCH 25.1 (L) 01/11/2023   RDW 17.8 (H) 01/11/2023   PLT 392 01/11/2023   Last metabolic panel Lab Results  Component Value Date   GLUCOSE 78 01/11/2023   NA  141 01/11/2023   K 4.5 01/11/2023   CL 106 01/11/2023   CO2 22 01/11/2023   BUN 10 01/11/2023   CREATININE 0.64 01/11/2023   EGFR 111 01/11/2023   CALCIUM 9.2 01/11/2023   PROT 7.8 01/11/2023   ALBUMIN 4.2 01/11/2023   LABGLOB 3.6 01/11/2023   BILITOT 0.2 01/11/2023   ALKPHOS 75 01/11/2023   AST 15 01/11/2023   ALT 16 01/11/2023   Last lipids Lab Results  Component Value Date   CHOL 181 01/11/2023   HDL 40 01/11/2023   LDLCALC 130 (H) 01/11/2023   TRIG 59 01/11/2023   CHOLHDL 4.5 (H) 01/11/2023   Last hemoglobin A1c No results found for: HGBA1C Last thyroid  functions Lab Results  Component Value Date   TSH 1.53 05/30/2019   Last vitamin D No results found for: 25OHVITD2, 25OHVITD3, VD25OH Last vitamin B12 and Folate No results found for: VITAMINB12, FOLATE    No results found for any visits on 04/09/24.    Assessment & Plan   Annual wellness visit done today including the all of the  following: Reviewed patient's Family and Medical History Reviewed and updated list of patient's medical providers Assessment of cognitive impairment was done Assessed patient's functional ability Established a written schedule for health screening services Health Risk Assessent Completed and Reviewed  Exercise Activities and Dietary recommendations  Goals   Smoking cessation -      Immunization History  Administered Date(s) Administered   PFIZER(Purple Top)SARS-COV-2 Vaccination 09/26/2019, 10/27/2019   Tdap 03/06/2013, 05/09/2022    Health Maintenance  Topic Date Due   Cervical Cancer Screening (HPV/Pap Cotest)  12/05/2022   COVID-19 Vaccine (3 - 2025-26 season) 04/25/2024 (Originally 11/05/2023)   Hepatitis B Vaccines 19-59 Average Risk (1 of 3 - 19+ 3-dose series) 04/09/2025 (Originally 04/19/1996)   Influenza Vaccine  04/01/2039 (Originally 10/05/2023)   Mammogram  02/13/2025   Colonoscopy  06/17/2029   DTaP/Tdap/Td (3 - Td or Tdap) 05/08/2032   HPV VACCINES (No Doses Required) Completed   Hepatitis C Screening  Completed   HIV Screening  Completed   Meningococcal B Vaccine  Aged Out   Pneumococcal Vaccine  Discontinued     Discussed health benefits of physical activity, and encouraged her to engage in regular exercise appropriate for her age and condition.    Problem List Items Addressed This Visit       Other   Routine adult health maintenance - Primary   Relevant Orders   CBC with Differential/Platelet   Hemoglobin A1c   TSH   Lipid panel   Comprehensive metabolic panel with GFR   Other Visit Diagnoses       Cigarette nicotine dependence without complication       Relevant Medications   buPROPion  (ZYBAN ) 150 MG 12 hr tablet       No follow-ups on file.     Benton LITTIE Gave, PA       [1]  Social History Tobacco Use   Smoking status: Every Day    Current packs/day: 0.50    Average packs/day: 0.5 packs/day for 18.0 years (9.0 ttl pk-yrs)     Types: Cigarettes   Smokeless tobacco: Never  Vaping Use   Vaping status: Never Used  Substance Use Topics   Alcohol use: Yes    Comment: socially   Drug use: Never  [2]  No outpatient medications prior to visit.   No facility-administered medications prior to visit.  [3]  Allergies Allergen Reactions   Sulfa Antibiotics Hives  and Other (See Comments)    Blindness   Witch Hazel Swelling and Rash   Witch Hazel Rash and Swelling   "

## 2024-04-10 ENCOUNTER — Encounter: Payer: Self-pay | Admitting: Urgent Care

## 2024-04-10 ENCOUNTER — Ambulatory Visit: Payer: Self-pay | Admitting: Urgent Care

## 2024-04-10 LAB — CBC WITH DIFFERENTIAL/PLATELET
Basophils Absolute: 0 10*3/uL (ref 0.0–0.2)
Basos: 0 %
EOS (ABSOLUTE): 0.2 10*3/uL (ref 0.0–0.4)
Eos: 2 %
Hematocrit: 35 % (ref 34.0–46.6)
Hemoglobin: 10.9 g/dL — ABNORMAL LOW (ref 11.1–15.9)
Immature Grans (Abs): 0 10*3/uL (ref 0.0–0.1)
Immature Granulocytes: 0 %
Lymphocytes Absolute: 2 10*3/uL (ref 0.7–3.1)
Lymphs: 20 %
MCH: 24.4 pg — ABNORMAL LOW (ref 26.6–33.0)
MCHC: 31.1 g/dL — ABNORMAL LOW (ref 31.5–35.7)
MCV: 79 fL (ref 79–97)
Monocytes Absolute: 0.6 10*3/uL (ref 0.1–0.9)
Monocytes: 6 %
Neutrophils Absolute: 7.3 10*3/uL — ABNORMAL HIGH (ref 1.4–7.0)
Neutrophils: 72 %
Platelets: 459 10*3/uL — ABNORMAL HIGH (ref 150–450)
RBC: 4.46 x10E6/uL (ref 3.77–5.28)
RDW: 17.7 % — ABNORMAL HIGH (ref 11.7–15.4)
WBC: 10.2 10*3/uL (ref 3.4–10.8)

## 2024-04-10 LAB — COMPREHENSIVE METABOLIC PANEL WITH GFR
ALT: 17 [IU]/L (ref 0–32)
AST: 20 [IU]/L (ref 0–40)
Albumin: 4 g/dL (ref 3.9–4.9)
Alkaline Phosphatase: 77 [IU]/L (ref 41–116)
BUN/Creatinine Ratio: 14 (ref 9–23)
BUN: 7 mg/dL (ref 6–24)
Bilirubin Total: 0.2 mg/dL (ref 0.0–1.2)
CO2: 20 mmol/L (ref 20–29)
Calcium: 8.9 mg/dL (ref 8.7–10.2)
Chloride: 104 mmol/L (ref 96–106)
Creatinine, Ser: 0.51 mg/dL — ABNORMAL LOW (ref 0.57–1.00)
Globulin, Total: 3.7 g/dL (ref 1.5–4.5)
Glucose: 67 mg/dL — ABNORMAL LOW (ref 70–99)
Potassium: 4.7 mmol/L (ref 3.5–5.2)
Sodium: 139 mmol/L (ref 134–144)
Total Protein: 7.7 g/dL (ref 6.0–8.5)
eGFR: 117 mL/min/{1.73_m2}

## 2024-04-10 LAB — TSH: TSH: 2.76 u[IU]/mL (ref 0.450–4.500)

## 2024-04-10 LAB — LIPID PANEL
Chol/HDL Ratio: 4.8 ratio — ABNORMAL HIGH (ref 0.0–4.4)
Cholesterol, Total: 177 mg/dL (ref 100–199)
HDL: 37 mg/dL — ABNORMAL LOW
LDL Chol Calc (NIH): 127 mg/dL — ABNORMAL HIGH (ref 0–99)
Triglycerides: 68 mg/dL (ref 0–149)
VLDL Cholesterol Cal: 13 mg/dL (ref 5–40)

## 2024-04-10 LAB — HEMOGLOBIN A1C
Est. average glucose Bld gHb Est-mCnc: 108 mg/dL
Hgb A1c MFr Bld: 5.4 % (ref 4.8–5.6)

## 2024-05-19 ENCOUNTER — Encounter: Admitting: Obstetrics & Gynecology
